# Patient Record
Sex: Male | Born: 1975 | Hispanic: No | Marital: Married | Smoking: Former smoker
Health system: Southern US, Community
[De-identification: ages and names within clinical notes are randomized; demographics above are authoritative.]

---

## 1999-05-13 ENCOUNTER — Emergency Department (HOSPITAL_COMMUNITY): Admission: EM | Admit: 1999-05-13 | Discharge: 1999-05-13 | Payer: Self-pay | Admitting: Emergency Medicine

## 2015-02-02 ENCOUNTER — Ambulatory Visit (INDEPENDENT_AMBULATORY_CARE_PROVIDER_SITE_OTHER): Payer: Self-pay | Admitting: Emergency Medicine

## 2015-02-02 VITALS — BP 132/90 | HR 109 | Temp 97.8°F | Resp 16 | Ht 71.0 in | Wt 209.0 lb

## 2015-02-02 DIAGNOSIS — M7711 Lateral epicondylitis, right elbow: Secondary | ICD-10-CM

## 2015-02-02 MED ORDER — METHYLPREDNISOLONE ACETATE 80 MG/ML IJ SUSP: 80.0000 mg | Freq: Once | INTRAMUSCULAR | Status: DC

## 2015-02-02 MED ORDER — NAPROXEN SODIUM 550 MG PO TABS: 550.0000 mg | ORAL_TABLET | Freq: Two times a day (BID) | ORAL | Status: DC

## 2015-02-02 MED ORDER — TENNIS ELBOW STRAP MISC: Status: DC

## 2015-02-02 NOTE — Progress Notes (Signed)
Subjective:  Patient ID: Donald Estes, male    DOB: 09/12/1975  Age: 39 y.o. MRN: [id number]  CC: Elbow Pain and Leg Pain   HPI Donald Estes presents  patient has a three-month history of pain in his lateral epicondyle right elbow he has no history of direct injury. He thinks he may have bumped it on something. He has no history of fever or chills no gout. No cellulitis. Said that he's had intermittent swelling in his lateral elbow. He works doing [employer].  History Donald Estes has no past medical history on file.   He has no past surgical history on file.   His  family history includes Diabetes in his mother.  He   reports that he quit smoking about 10 months ago. He does not have any smokeless tobacco history on file. He reports that he does not drink alcohol or use illicit drugs.  No outpatient prescriptions prior to visit.   No facility-administered medications prior to visit.    Social History   Social History  . Marital Status: Married    Spouse Name: N/A  . Number of Children: N/A  . Years of Education: N/A   Social History Main Topics  . Smoking status: Former Smoker    Quit date: 03/13/2014  . Smokeless tobacco: None  . Alcohol Use: No  . Drug Use: No  . Sexual Activity: Not Asked   Other Topics Concern  . None   Social History Narrative  . None     Review of Systems  Constitutional: Negative for fever, chills and appetite change.  HENT: Negative for congestion, ear pain, postnasal drip, sinus pressure and sore throat.   Eyes: Negative for pain and redness.  Respiratory: Negative for cough, shortness of breath and wheezing.   Cardiovascular: Negative for leg swelling.  Gastrointestinal: Negative for nausea, vomiting, abdominal pain, diarrhea, constipation and blood in stool.  Endocrine: Negative for polyuria.  Genitourinary: Negative for dysuria, urgency, frequency and flank pain.  Musculoskeletal: Negative for gait problem.  Skin: Negative for rash.    Neurological: Negative for weakness and headaches.  Psychiatric/Behavioral: Negative for confusion and decreased concentration. The patient is not nervous/anxious.     Objective:  BP 132/90 mmHg  Pulse 109  Temp(Src) 97.8 F (36.6 C) (Oral)  Resp 16  Ht 5\' 11"  (1.803 m)  Wt 209 lb (94.802 kg)  BMI 29.16 kg/m2  SpO2 98%  Physical Exam  Constitutional: He is oriented to person, place, and time. He appears well-developed and well-nourished. No distress.  HENT:  Head: Normocephalic and atraumatic.  Right Ear: External ear normal.  Left Ear: External ear normal.  Nose: Nose normal.  Eyes: Conjunctivae and EOM are normal. Pupils are equal, round, and reactive to light. No scleral icterus.  Neck: Normal range of motion. Neck supple. No tracheal deviation present.  Cardiovascular: Normal rate, regular rhythm and normal heart sounds.   Pulmonary/Chest: Effort normal. No respiratory distress. He has no wheezes. He has no rales.  Abdominal: He exhibits no mass. There is no tenderness. There is no rebound and no guarding.  Musculoskeletal: He exhibits no edema.       Right elbow: He exhibits decreased range of motion. Tenderness found. Lateral epicondyle tenderness noted.  Lymphadenopathy:    He has no cervical adenopathy.  Neurological: He is alert and oriented to person, place, and time. Coordination normal.  Skin: Skin is warm and dry. No rash noted.  Psychiatric: He has a normal mood and affect. His  behavior is normal.      Assessment & Plan:   Donald Estes was seen today for elbow pain and leg pain.  Diagnoses and all orders for this visit:  Tennis elbow, right -     methylPREDNISolone acetate (DEPO-MEDROL) injection 80 mg; Inject 1 mL (80 mg total) into the muscle once.  Other orders -     naproxen sodium (ANAPROX DS) 550 MG tablet; Take 1 tablet (550 mg total) by mouth 2 (two) times daily with a meal. -     Elastic Bandages & Supports (TENNIS ELBOW STRAP) MISC; Use daily   I  am having Donald Estes start on naproxen sodium and Tennis Elbow Strap. I am also having him maintain his ibuprofen and naproxen sodium. We will continue to administer methylPREDNISolone acetate.  Meds ordered this encounter  Medications  . ibuprofen (ADVIL,MOTRIN) 200 MG tablet    Sig: Take 200 mg by mouth every 6 (six) hours as needed.  . naproxen sodium (ANAPROX) 220 MG tablet    Sig: Take 220 mg by mouth 2 (two) times daily with a meal.  . methylPREDNISolone acetate (DEPO-MEDROL) injection 80 mg    Sig:   . naproxen sodium (ANAPROX DS) 550 MG tablet    Sig: Take 1 tablet (550 mg total) by mouth 2 (two) times daily with a meal.    Dispense:  40 tablet    Refill:  0  . Elastic Bandages & Supports (TENNIS ELBOW STRAP) MISC    Sig: Use daily    Dispense:  1 each    Refill:  0   He was injected with 40 mg Depo-Medrol and good relief of his pain acutely due to the local anesthetic and was put on Anaprox and tennis elbow support of follow-up as needed  Appropriate red flag conditions were discussed with the patient as well as actions that should be taken.  Patient expressed his understanding.  Follow-up: Return if symptoms worsen or fail to improve.  Carmelina Dane, MD

## 2015-02-02 NOTE — Patient Instructions (Signed)
Codo de tenista  (Tennis Elbow)  El codo de tenista (epicondilitis lateral) es la inflamación de los tendones externos del antebrazo cerca del codo. Los tendones unen los músculos con los huesos. Los tendones externos del antebrazo intervienen en la extensión de la muñeca y su punto de unión se encuentra en la parte externa del codo. A menudo, el codo de tenista aparece en aquellos que juegan al tenis, pero cualquier persona puede tener la afección si extiende la muñeca o gira el antebrazo repetidas veces.  CAUSAS  La causa de la afección es la extensión de la muñeca y el uso de las manos de manera reiterada. Puede ser consecuencia de la práctica de deportes o la realización de tareas que exigen movimientos repetitivos del antebrazo. El codo de tenista también puede deberse a una lesión.  FACTORES DE RIESGO  Corre un riesgo más elevado de tener codo de tenista si juega al tenis o practica otro deporte con raqueta. Además, si usa frecuentemente las manos para trabajar. Es más probable que esta afección se manifieste en:  · Los músicos.  · Los carpinteros, los pintores y los plomeros.  · Los cocineros.  · Los cajeros.  · Los trabajadores de fábricas.  · Los trabajadores de la construcción.  · Los carniceros.  · Los usuarios de computadoras.  SÍNTOMAS  Los síntomas de esta afección incluyen lo siguiente:  · Dolor y sensibilidad a la palpación en el antebrazo y la parte externa del codo. Tal vez solo sienta dolor cuando use el brazo, o incluso cuando no lo haga.  · Una sensación de ardor que va desde el codo hasta el brazo.  · Agarre débil de las manos.  DIAGNÓSTICO   Este trastorno se puede diagnosticar mediante la historia clínica y un examen físico. También pueden hacerle otros estudios, por ejemplo:  · Radiografías.  · Resonancia magnética.  TRATAMIENTO  El médico le recomendará que haga cambios en el estilo de vida, por ejemplo, que ponga el brazo en reposo y se aplique hielo. El tratamiento también puede incluir  lo siguiente:  · Medicamentos para la inflamación. Estos pueden incluir inyecciones de cortisona si el dolor continúa.  · Fisioterapia. Este tratamiento puede incluir masajes o ejercicios.  · Un dispositivo ortopédico para el codo.  En última instancia, se puede recomendar cirugía si el dolor no desaparece con el tratamiento.  INSTRUCCIONES PARA EL CUIDADO EN EL HOGAR  Actividad  · Mantenga el codo y la muñeca en reposo como se lo haya indicado el médico. Intente no realizar ninguna actividad que pueda haber causado el problema hasta que el médico se lo permita.  · Si un fisioterapeuta le enseña ejercicios, hágalos como se lo hayan indicado.  · Si levanta un objeto, hágalo con la palma de la mano hacia arriba. Esto reduce el esfuerzo en el codo.  Estilo de vida  · Si el codo de tenista se debe a los deportes, revise el equipo y asegúrese de lo siguiente:    Que lo usa correctamente.    Que es la opción más adecuada para usted.  · Si el codo de tenista se debe al trabajo, tómese descansos con frecuencia, si es posible. Hable con el gerente sobre la mejor forma de realizar las tareas de un modo que sea seguro.    Si el codo de tenista se debe al uso de la computadora, hable con el gerente sobre los cambios que pueden hacerse en su lugar de trabajo.  Instrucciones generales  ·   Si se lo indican, aplique hielo sobre la zona dolorida:    Ponga el hielo en una bolsa plástica.    Coloque una toalla entre la piel y la bolsa de hielo.    Coloque el hielo durante 20 minutos, 2 a 3 veces por día.  · Tome los medicamentos solamente como se lo haya indicado el médico.  · Si le indicaron que use un dispositivo ortopédico, úselo como se lo haya indicado el médico.  · Concurra a todas las visitas de control como se lo haya indicado el médico. Esto es importante.  SOLICITE ATENCIÓN MÉDICA SI:  · El dolor no mejora con el tratamiento.  · El dolor empeora.  · Tiene adormecimiento o debilidad en el antebrazo, la mano o los dedos de la  mano.     Esta información no tiene como fin reemplazar el consejo del médico. Asegúrese de hacerle al médico cualquier pregunta que tenga.     Document Released: 02/27/2005 Document Revised: 07/14/2014  Elsevier Interactive Patient Education ©2016 Elsevier Inc.

## 2015-11-02 ENCOUNTER — Encounter: Payer: Self-pay | Admitting: Physician Assistant

## 2015-11-02 ENCOUNTER — Ambulatory Visit (INDEPENDENT_AMBULATORY_CARE_PROVIDER_SITE_OTHER): Payer: Self-pay | Admitting: Physician Assistant

## 2015-11-02 VITALS — BP 122/80 | HR 122 | Temp 98.2°F | Resp 17 | Ht 71.0 in | Wt 208.0 lb

## 2015-11-02 DIAGNOSIS — M2142 Flat foot [pes planus] (acquired), left foot: Secondary | ICD-10-CM

## 2015-11-02 DIAGNOSIS — M7742 Metatarsalgia, left foot: Secondary | ICD-10-CM

## 2015-11-02 DIAGNOSIS — M7741 Metatarsalgia, right foot: Secondary | ICD-10-CM

## 2015-11-02 DIAGNOSIS — M2141 Flat foot [pes planus] (acquired), right foot: Secondary | ICD-10-CM

## 2015-11-02 MED ORDER — MELOXICAM 15 MG PO TABS: 15.0000 mg | ORAL_TABLET | Freq: Every day | ORAL | 1 refills | Status: DC

## 2015-11-02 NOTE — Progress Notes (Signed)
Donald Estes  MRN: [id number] DOB: 12/20/1975  PCP: No PCP Per Patient  Subjective:  Pt is a 40 year old male presenting to clinic for joint pain x 1 year.  His pain is located in his right knee and both ankles, is constant and present every day. Less painful when he rests, more painful when he is walking. The most painful area is his right foot on the inside of his ankle and the top and bottom aspect of his big toe joint.  Taking Ibuprofen and OTC glucosamine.  Reports lower back pain last year after volley ball games. His pain was so bad he "walked crooked". He says it wasn't until after his back pain that his right knee started hurting, then his feet.    He says his big toe joint occasionally gets hot and painful. Eats mostly fast food daily. Coffee, monster, coke. No alcohol.  Went to Dr. Tasia [facility] last month. Had labs drawn and x-ray of his right foot, was told it was borderline gout. No medication was prescribed at the visit.      Review of Systems  Constitutional: Negative.   Cardiovascular: Negative.   Gastrointestinal: Negative.   Genitourinary: Negative for difficulty urinating, dysuria and testicular pain.  Musculoskeletal: Positive for arthralgias (right knee, both ankles), back pain (chronic low back pain), gait problem and joint swelling. Negative for myalgias, neck pain and neck stiffness.  Skin: Positive for color change (right big toe occasionally turns red). Negative for wound.    There are no active problems to display for this patient.   Current Outpatient Prescriptions on File Prior to Visit  Medication Sig Dispense Refill  . ibuprofen (ADVIL,MOTRIN) 200 MG tablet Take 200 mg by mouth every 6 (six) hours as needed.    . naproxen sodium (ANAPROX) 220 MG tablet Take 220 mg by mouth 2 (two) times daily with a meal.     Current Facility-Administered Medications on File Prior to Visit  Medication Dose Route Frequency Provider Last Rate Last Dose  .  methylPREDNISolone acetate (DEPO-MEDROL) injection 80 mg  80 mg Intramuscular Once Carmelina DaneJeffery S Anderson, MD        No Known Allergies  Objective:  BP 122/80 (BP Location: Left Arm, Patient Position: Sitting, Cuff Size: Normal)   Pulse (!) 122   Temp 98.2 F (36.8 C) (Oral)   Resp 17   Ht 5\' 11"  (1.803 m)   Wt 208 lb (94.3 kg)   SpO2 97%   BMI 29.01 kg/m   Physical Exam  Constitutional: He is oriented to person, place, and time and well-developed, well-nourished, and in no distress. No distress.  Cardiovascular: Normal rate, regular rhythm and normal heart sounds.   Musculoskeletal:  Right medial malleolus is swollen compared to left. No deformity, erythema, sign of trauma appreciated. TTP medial malleolus, right.  Pes planus noted. Mild eversion of heel with gait. Pain elicited with walking.     Neurological: He is alert and oriented to person, place, and time. GCS score is 15.  Skin: Skin is warm and dry.  Psychiatric: Mood, memory, affect and judgment normal.  Vitals reviewed.   Called [facility] for results.  Uric acid level 5.5 last month.  Assessment and Plan :  1. Metatarsalgia of both feet 2. Pes planus of both feet - meloxicam (MOBIC) 15 MG tablet; Take 1 tablet (15 mg total) by mouth daily.  Dispense: 30 tablet; Refill: 1 - Plan discussed with patient to try insoles to  correct gait. History of back pain prior to onset of knee, then ankle pain suggests this is ortho in nature. Patient does not have insurance, so will start with proper footwear/insoles. He agrees. RTC if symptoms are not improving in 1-2 months. Will refer to ortho for evaluation and orthotics at that time.  - Patient education. Instructions printed out and discussed with patient about diet to prevent gout attacks.    Marco CollieWhitney Giorgio Chabot, PA-C  [facility] 11/02/2015 1:49 PM

## 2015-11-02 NOTE — Patient Instructions (Addendum)
Off and Running for insoles    IF you received an x-ray today, you will receive an invoice from [facility]. Please contact [facility] at [phone] with questions or concerns regarding your invoice.   IF you received labwork today, you will receive an invoice from [facility]. Please contact [facility] at [phone] with questions or concerns regarding your invoice.   Our billing staff will not be able to assist you with questions regarding bills from these companies.  You will be contacted with the lab results as soon as they are available. The fastest way to get your results is to activate your My Chart account. Instructions are located on the last page of this paperwork. If you have not heard from us regarding the results in 2 weeks, please contact this office.      Gout Gout is an inflammatory arthritis caused by a buildup of uric acid crystals in the joints. Uric acid is a chemical that is normally present in the blood. When the level of uric acid in the blood is too high it can form crystals that deposit in your joints and tissues. This causes joint redness, soreness, and swelling (inflammation). Repeat attacks are common. Over time, uric acid crystals can form into masses (tophi) near a joint, destroying bone and causing disfigurement. Gout is treatable and often preventable. CAUSES  The disease begins with elevated levels of uric acid in the blood. Uric acid is produced by your body when it breaks down a naturally found substance called purines. Certain foods you eat, such as meats and fish, contain high amounts of purines. Causes of an elevated uric acid level include:  Being passed down from parent to child (heredity).  Diseases that cause increased uric acid production (such as obesity, psoriasis, and certain cancers).  Excessive alcohol use.  Diet, especially diets rich in meat and seafood.  Medicines, including certain  cancer-fighting medicines (chemotherapy), water pills (diuretics), and aspirin.  Chronic kidney disease. The kidneys are no longer able to remove uric acid well.  Problems with metabolism. Conditions strongly associated with gout include:  Obesity.  High blood pressure.  High cholesterol.  Diabetes. Not everyone with elevated uric acid levels gets gout. It is not understood why some people get gout and others do not. Surgery, joint injury, and eating too much of certain foods are some of the factors that can lead to gout attacks. SYMPTOMS   An attack of gout comes on quickly. It causes intense pain with redness, swelling, and warmth in a joint.  Fever can occur.  Often, only one joint is involved. Certain joints are more commonly involved:  Base of the big toe.  Knee.  Ankle.  Wrist.  Finger. Without treatment, an attack usually goes away in a few days to weeks. Between attacks, you usually will not have symptoms, which is different from many other forms of arthritis. DIAGNOSIS  Your caregiver will suspect gout based on your symptoms and exam. In some cases, tests may be recommended. The tests may include:  Blood tests.  Urine tests.  X-rays.  Joint fluid exam. This exam requires a needle to remove fluid from the joint (arthrocentesis). Using a microscope, gout is confirmed when uric acid crystals are seen in the joint fluid. TREATMENT  There are two phases to gout treatment: treating the sudden onset (acute) attack and preventing attacks (prophylaxis).  Treatment of an Acute Attack.  Medicines are used. These include anti-inflammatory medicines or steroid medicines.  An injection  of steroid medicine into the affected joint is sometimes necessary.  The painful joint is rested. Movement can worsen the arthritis.  You may use warm or cold treatments on painful joints, depending which works best for you.  Treatment to Prevent Attacks.  If you suffer from  frequent gout attacks, your caregiver may advise preventive medicine. These medicines are started after the acute attack subsides. These medicines either help your kidneys eliminate uric acid from your body or decrease your uric acid production. You may need to stay on these medicines for a very long time.  The early phase of treatment with preventive medicine can be associated with an increase in acute gout attacks. For this reason, during the first few months of treatment, your caregiver may also advise you to take medicines usually used for acute gout treatment. Be sure you understand your caregiver's directions. Your caregiver may make several adjustments to your medicine dose before these medicines are effective.  Discuss dietary treatment with your caregiver or dietitian. Alcohol and drinks high in sugar and fructose and foods such as meat, poultry, and seafood can increase uric acid levels. Your caregiver or dietitian can advise you on drinks and foods that should be limited. HOME CARE INSTRUCTIONS   Do not take aspirin to relieve pain. This raises uric acid levels.  Only take over-the-counter or prescription medicines for pain, discomfort, or fever as directed by your caregiver.  Rest the joint as much as possible. When in bed, keep sheets and blankets off painful areas.  Keep the affected joint raised (elevated).  Apply warm or cold treatments to painful joints. Use of warm or cold treatments depends on which works best for you.  Use crutches if the painful joint is in your leg.  Drink enough fluids to keep your urine clear or pale yellow. This helps your body get rid of uric acid. Limit alcohol, sugary drinks, and fructose drinks.  Follow your dietary instructions. Pay careful attention to the amount of protein you eat. Your daily diet should emphasize fruits, vegetables, whole grains, and fat-free or low-fat milk products. Discuss the use of coffee, vitamin C, and cherries with your  caregiver or dietitian. These may be helpful in lowering uric acid levels.  Maintain a healthy body weight. SEEK MEDICAL CARE IF:   You develop diarrhea, vomiting, or any side effects from medicines.  You do not feel better in 24 hours, or you are getting worse. SEEK IMMEDIATE MEDICAL CARE IF:   Your joint becomes suddenly more tender, and you have chills or a fever. MAKE SURE YOU:   Understand these instructions.  Will watch your condition.  Will get help right away if you are not doing well or get worse.   This information is not intended to replace advice given to you by your health care provider. Make sure you discuss any questions you have with your health care provider.   Document Released: 02/25/2000 Document Revised: 03/20/2014 Document Reviewed: 10/11/2011 Elsevier Interactive Patient Education [employer]2016 Elsevier Inc.

## 2015-12-31 ENCOUNTER — Ambulatory Visit (INDEPENDENT_AMBULATORY_CARE_PROVIDER_SITE_OTHER): Payer: Self-pay | Admitting: Physician Assistant

## 2015-12-31 VITALS — BP 122/74 | HR 116 | Temp 98.3°F | Resp 18 | Ht 71.0 in | Wt 210.0 lb

## 2015-12-31 DIAGNOSIS — M7742 Metatarsalgia, left foot: Secondary | ICD-10-CM

## 2015-12-31 DIAGNOSIS — M7741 Metatarsalgia, right foot: Secondary | ICD-10-CM

## 2015-12-31 DIAGNOSIS — M79671 Pain in right foot: Secondary | ICD-10-CM

## 2015-12-31 DIAGNOSIS — M79672 Pain in left foot: Secondary | ICD-10-CM

## 2015-12-31 MED ORDER — DICLOFENAC SODIUM 1 % TD GEL: 2.0000 g | Freq: Four times a day (QID) | TRANSDERMAL | 10 refills | Status: DC

## 2015-12-31 MED ORDER — MELOXICAM 15 MG PO TABS: 15.0000 mg | ORAL_TABLET | Freq: Every day | ORAL | 1 refills | Status: DC

## 2015-12-31 NOTE — Progress Notes (Signed)
   Donald Estes  MRN: [id number] DOB: 03/04/1976  PCP: No PCP Per Patient  Subjective:  Pt is a 40 year old male who presents to clinic for follow-up foot pain x 1 year. He was here 2 months ago for the same. At that time he was advised to use proper orthotics and use Mobic for pain. Since that visit, he notes pain eased with Mobic and the orthotic insoles, however is not completely gone.  Pain has moved from the bottom of his foot to the inferoanterior aspect of the inside of his ankle. Pain is worse with walking and walking up and down stairs. His wife states this is affecting his daily activity as they could not go to the fair because there is too much walking and he would be in too much pain.  Of note, the pain in his back and right knee is gone.  Review of Systems  Constitutional: Positive for activity change (due to foot pain). Negative for chills, fatigue and fever.  Respiratory: Negative for chest tightness and shortness of breath.   Cardiovascular: Negative for chest pain and palpitations.  Musculoskeletal: Positive for gait problem (due to foot pain). Negative for back pain and joint swelling.  Skin: Negative.     There are no active problems to display for this patient.   Current Outpatient Prescriptions on File Prior to Visit  Medication Sig Dispense Refill  . meloxicam (MOBIC) 15 MG tablet Take 1 tablet (15 mg total) by mouth daily. 30 tablet 1  . glucosamine-chondroitin 500-400 MG tablet Take 1 tablet by mouth 3 (three) times daily.     Current Facility-Administered Medications on File Prior to Visit  Medication Dose Route Frequency Provider Last Rate Last Dose  . methylPREDNISolone acetate (DEPO-MEDROL) injection 80 mg  80 mg Intramuscular Once Carmelina DaneJeffery S Anderson, MD        No Known Allergies  Objective:  BP 122/74 (BP Location: Right Arm, Patient Position: Sitting, Cuff Size: Small)   Pulse (!) 116   Temp 98.3 F (36.8 C) (Oral)   Resp 18   Ht 5\' 11"  (1.803 m)    Wt 210 lb (95.3 kg)   SpO2 99%   BMI 29.29 kg/m   Physical Exam  Constitutional: He is oriented to person, place, and time and well-developed, well-nourished, and in no distress. No distress.  Cardiovascular: Normal rate, regular rhythm and normal heart sounds.   Musculoskeletal:       Left foot: There is tenderness. There is no swelling, no crepitus and no deformity.       Feet:  Neurological: He is alert and oriented to person, place, and time. GCS score is 15.  Skin: Skin is warm and dry.  Psychiatric: Mood, memory, affect and judgment normal.  Vitals reviewed.   Assessment and Plan :  1. Pain in both feet 2. Metatarsalgia of both feet - diclofenac sodium (VOLTAREN) 1 % GEL; Apply 2 g topically 4 (four) times daily.  Dispense: 100 g; Refill: 10 - meloxicam (MOBIC) 15 MG tablet; Take 1 tablet (15 mg total) by mouth daily.  Dispense: 30 tablet; Refill: 1 - Patient is to follow-up with Dr. Denyse Amassorey for orthopedic rehab and follow-up.    Marco CollieWhitney Justa Hatchell, PA-C  [facility] 12/31/2015 4:20 PM

## 2015-12-31 NOTE — Patient Instructions (Addendum)
Please follow up with Dr. Georgina Snell.   Thank you for coming in today. I hope you feel we met your needs.  Feel free to call [facility] if you have any questions or further requests.  Please consider signing up for MyChart if you do not already have it, as this is a great way to communicate with me.  Best,  Whitney McVey, PA-C    IF you received an x-ray today, you will receive an invoice from [facility]. Please contact [facility] at [phone] with questions or concerns regarding your invoice.   IF you received labwork today, you will receive an invoice from [employer]. Please contact [facility] at [phone] with questions or concerns regarding your invoice.   Our billing staff will not be able to assist you with questions regarding bills from these companies.  You will be contacted with the lab results as soon as they are available. The fastest way to get your results is to activate your My Chart account. Instructions are located on the last page of this paperwork. If you have not heard from [place] regarding the results in 2 weeks, please contact this office.

## 2016-01-05 ENCOUNTER — Encounter: Payer: Self-pay | Admitting: Family Medicine

## 2016-01-05 ENCOUNTER — Ambulatory Visit (INDEPENDENT_AMBULATORY_CARE_PROVIDER_SITE_OTHER): Payer: Self-pay | Admitting: Family Medicine

## 2016-01-05 DIAGNOSIS — M76821 Posterior tibial tendinitis, right leg: Secondary | ICD-10-CM | POA: Insufficient documentation

## 2016-01-05 DIAGNOSIS — M76822 Posterior tibial tendinitis, left leg: Secondary | ICD-10-CM

## 2016-01-05 MED ORDER — NITROGLYCERIN 0.2 MG/HR TD PT24: MEDICATED_PATCH | TRANSDERMAL | 1 refills | Status: DC

## 2016-01-05 NOTE — Progress Notes (Signed)
   Subjective:    I'm seeing this patient as a consultation for:    JPMorgan Chase & CoElizabeth Whitney McVey, PA-C      CC: BL ankle pain  HPI: She notes a several week history of bilateral medial ankle pain. Pain is quite severe at times. Pain is worse with standing and walking and better with rest. His pain is located at the medial ankle extending from the ankle down into the mid foot. He denies any further radiating pain weakness or numbness. No fevers chills nausea vomiting or diarrhea. No injury noted. He is seen in urgent care the 20th and given diclofenac gel which he's used a few times with not much benefit yet.  Past medical history, Surgical history, Family history not pertinant except as noted below, Social history, Allergies, and medications have been entered into the medical record, reviewed, and no changes needed.   Review of Systems: No headache, visual changes, nausea, vomiting, diarrhea, constipation, dizziness, abdominal pain, skin rash, fevers, chills, night sweats, weight loss, swollen lymph nodes, body aches, joint swelling, muscle aches, chest pain, shortness of breath, mood changes, visual or auditory hallucinations.   Objective:    Vitals:   01/05/16 1502  BP: (!) 146/82  Pulse: 98   General: Well Developed, well nourished, and in no acute distress.  Neuro/Psych: Alert and oriented x3, extra-ocular muscles intact, able to move all 4 extremities, sensation grossly intact. Skin: Warm and dry, no rashes noted.  Respiratory: Not using accessory muscles, speaking in full sentences, trachea midline.  Cardiovascular: Pulses palpable, no extremity edema. Abdomen: Does not appear distended. MSK: Ankles bilaterally are relatively normal appearing without significant swelling. Patient has significant pain with toe standing and an absence of normal heel varus. He is tender to palpation along the course of the posterior tibialis tendon and has pain with resisted foot inversion. Pulses  capillary refill and sensation are intact distally.  Limited musculoskeletal ultrasound of the medial ankles bilaterally. Things bilaterally have intact. Posterior tibialis tendon. However the tendon is encased within a hypoechoic fluid containing sheath extending from the medial malleolus to the tarsal tunnel. The swelling is worse at the tarsal tunnel. There is increased Doppler activity seen within the tendon and tendon sheath at the tarsal tunnel consistent with neovessels. No large blood flow seen within the tendon however. The tendon appears to be intact with no significant defects bilaterally. Normal bony structures bilaterally.  No results found for this or any previous visit (from the past 24 hour(s)). No results found.  Impression and Recommendations:    Assessment and Plan: 40 y.o. male with Posterior tibial tendinitis bilaterally. Patient did not get much benefit from diclofenac gel but has been doing some home exercise program which has helped. Plan to use nitroglycerine patches and home exercise program. Recheck in 1 month.    Discussed warning signs or symptoms. Please see discharge instructions. Patient expresses understanding.

## 2016-01-05 NOTE — Patient Instructions (Signed)
Thank you for coming in today. Return in 1 month if not better.  Continue the exercise.  Return sooner if needed.  Use the pads in the shoes.   Nitroglycerin Protocol   Apply 1/4 nitroglycerin patch to affected area daily.  Change position of patch within the affected area every 24 hours.  You may experience a headache during the first 1-2 weeks of using the patch, these should subside.  If you experience headaches after beginning nitroglycerin patch treatment, you may take your preferred over the counter pain reliever.  Another side effect of the nitroglycerin patch is skin irritation or rash related to patch adhesive.  Please notify our office if you develop more severe headaches or rash, and stop the patch.  Tendon healing with nitroglycerin patch may require 12 to 24 weeks depending on the extent of injury.  Men should not use if taking Viagra, Cialis, or Levitra.   Do not use if you have migraines or rosacea.    Tendinitis del tendn tibial posterior con rehabilitacin (Posterior Tibial Tendon Tendinitis With Rehab) La tendinitis es una enfermedad que se caracteriza por la inflamacin de un tendn o el revestimiento (vaina) que lo rodea. La inflamacin generalmente est causada por daos en el tendn, como un desgarro (esguince). Los esguinces se clasifican en tres categoras. Los esguinces de grado 1 ocasionan dolor, pero el tendn no est alargado. En los esguinces de grado 2 hay un ligamento distendido, debido a un estiramiento o desgarro parcial. En el esguince de grado 2 se mantiene la funcin, aunque sta puede disminuir. Un esguince en grado 3 es la ruptura completa del msculo o el tendn, y suele quedar incapacitada la funcin. Esta tendinitis afecta el tendn tibial posterior, que une los msculos de la zona inferior de la pierna al pie. Este tendn se [employer]ack parte posterior del tobillo y [place] a que el tobillo se extienda (flexin plantar) y rote Honor Junes  (rotacin medial). SNTOMAS  Dolor, sensibilidad, hinchazn, calor o enrojecimiento en la parte trasera de la zona interna del tobillo, en el tendn tibial posterior o la zona interna del pie.  Dolor que empeora con la flexin plantar o la rotacin medial del tobillo.  Ruido de "crack" (crepitacin) al mover o tocar el tendn. CAUSAS La tendinitis del tendn tibial posterior se produce cuando una lesin en el tendn tibial posterior produce una respuesta inflamatoria. Los mecanismos ms frecuentes de una lesin son:  Geographical information systems officer (ocurre a medida que envejecemos) que debilita el tendn y lo hace ms susceptible a las lesiones.  Estrs sobre el tendn que proviene de un sbito aumento en la intensidad, frecuencia o duracin del entrenamiento.  Traumatismo directo en el tobillo.  Regreso a las actividades antes de que la lesin se cure. LOS RIESGOS AUMENTAN CON:  Actividades que implican una flexin plantar estresante (saltar, patear o correr Trey Paula y hacia abajo por una superficie inclinada).  Poca fuerza y flexibilidad.  Pie plano.  Lesiones previas del pi, tobillo o pierna. PREVENCIN   Precalentamiento adecuado y elongacin antes de la [place].  Descanso y recuperacin entre actividades.  Mantener la forma fsica:  Earma [place], flexibilidad y resistencia muscular.  Capacidad cardiovascular.  Aprenda y USAA. Cuando sea posible, tener un entrenador que corrija la Administrator.  Realizar una rehabilitacin completa de Neomia Dear lesin previa del pie, el tobillo o la pierna.  Las personas que presentan pie plano deben usar soportes en los arcos (ortopdicos). PRONSTICO Si se trata  adecuadamente, generalmente es curable dentro de las 6 semanas. Este perodo puede ser ms breve in los casos de lesiones por traumatismos directos. COMPLICACIONES RELACIONADAS  Si no se trata adecuadamente, mayor tiempo demorar la curacin.  La  recurrencia frecuente de los sntomas puede dar como resultado un problema crnico.  Si no se trata, la ruptura parcial o completa del tendn requiere Azerbaijanciruga. TRATAMIENTO El tratamiento inicial incluye el uso de medicamentos y la aplicacin de hielo para reducir Chief Technology Officerel dolor y la inflamacin. Los ejercicios de elongacin y fortalecimiento pueden ayudar a reducir Chief Technology Officerel dolor con la Stratfordactividad. Los ejercicios pueden Management consultantrealizarse en el hogar o con un terapeuta. Generalmente, el mdico indicar la inmovilizacin del tobillo para permitir la curacin del tendn. Si tiene pie plano, le aconsejar el uso de un soporte de arco ortopdico. Si los sntomas persisten por ms de 6 meses de tratamiento no quirrgico (conservador), se Public relations account executiveindicar la ciruga. MEDICAMENTOS  Si necesita analgsicos, se recomiendan los antiinflamatorios no esteroides, como aspirina e ibuprofeno y otros calmantes menores, como acetaminofeno  No tome medicamentos para Chief Technology Officerel dolor dentro de los [address] das previos a la Azerbaijanciruga.  Los analgsicos prescriptos se indicarn si el mdico lo considera necesario. Utilcelos como se le indique y slo cuando lo necesite.  En algunos casos se indica una inyeccin de corticosteroides. Estas inyecciones deben reservarse para los [place] B[place]asos graves, porque slo se pueden administrar una determinada cantidad de veces. CALOR Y FRO   El tratamiento con fro EchoStaralivia el dolor y reduce la inflamacin. El fro debe aplicarse durante 10 a 15 minutos cada 2  3 horas para reducir la inflamacin y Chief Technology Officerel dolor e inmediatamente despus de cualquier actividad que agrava los sntomas. Utilice bolsas de hielo o masajee la zona con un trozo de hielo (masaje de hielo).  El calor puede usarse antes de Therapist, musicelongar y de las actividades de fortalecimiento indicadas por el profesional, le fisioterapeuta o Orthoptistel entrenador. Utilice una bolsa trmica o sumerja la lesin en agua caliente. SOLICITE ATENCIN MDICA SI:  El tratamiento no Radiographer, therapeuticle da buenos  resultados o el problema Jacksonvilleempeora.  Algn medicamento le produce BB&T Corporationefectos adversos. EJERCISIOS EJERCICIOS DE AMPLITUD DE MOVIMIENTOS Y ELONGACIN - Tendinitis del tendn tibial posterior Estos ejercicios lo ayudarn al comienzo de la rehabilitacin. Los sntomas podrn aliviarse con o sin asistencia adicional de su mdico, fisioterapeuta o Herbalistentrenador. Al completar estos ejercicios, recuerde:   Restaurar la flexibilidad del tejido ayuda a que las articulaciones recuperen el movimiento normal. Esto permite que el movimiento y la actividad sea ms saludables y menos dolorosos.  Para que sea efectiva, cada elongacin debe realizarse durante al menos 30 segundos.  La elongacin nunca debe ser dolorosa. Deber sentir slo un alargamiento o distensin suave del tejido que estira. AMPLITUD DE MOVIMIENTOS - Flexin plantar del tobillo   Sintese con la pierna derecha / izquierdo cruzada sobre la rodilla opuesta.  Use la mano opuesta para empujar la punta del pie y los dedos Lewisvillehacia usted.  Debe sentir un estiramiento en la parte superior del pie o tobillo. Mantenga esta posicin durante __________ segundos. Reptalo __________ veces. Realice este ejercicio __________ veces por da.  AMPLITUD DE MOVIMIENTOS - Eversin del tobillo  Sintese con la pierna derecha / izquierdo cruzada sobre la rodilla opuesta.  Sostenga el pie con la mano [place]uttont[place]opuesta, colocando el Halliburton Companypulgar sobre el pie y los otros dedos cruzando la parte inferior del pie.  Presione suavemente el pie hacia abajo con una rotacin suave para que los dedos ms  pequeos se eleven ligeramente.  Debe sentir un estiramiento suave en la zona interior del tobillo. Mantenga esta posicin durante __________ segundos. Reptalo __________ veces. Realice este ejercicio __________ veces por da.  AMPLITUD DE MOVIMIENTOS - Inversin del tobillo  Sintese con la pierna derecha / izquierdo cruzada sobre la rodilla opuesta.  Sostenga el pie con la mano  opuesta, colocando el pulgar en la parte inferior y los otros dedos cruzando la parte superior del pie.  Tire suavemente del pie de modo que el dedo pequeo se incline [place] usted y Multimedia programmer empuje el tarso hacia afuera.  Debe sentir un estiramiento suave en la zona exterior del tobillo. Mantenga esta posicin durante __________ segundos. Reptalo __________ veces. Realice este ejercicio __________ veces por da.  AMPLITUD DE MOVIMIENTOS - Flexin dorso plantar  Mientras est sentado con la rodilla derecha / izquierdo derecha, lleve la parte superior del pie hacia arriba flexionando el tobillo. Luego realice [employer], y apunte con los pies Luther.  Mantenga esta posicin durante __________ segundos.  Luego de completar la primera serie de ejercicios, reptalo con la rodilla flexionada. Reptalo __________ veces. Realice este ejercicio __________ veces por da.  AMPLITUD DE MOVIMIENTOS - Alfabeto con el tobillo  Imagine que el dedo gordo del pie derecha / izquierdo es un lpiz.  Con la cadera y la rodilla quietas, escriba todo el alfabeto con el "lpiz." Llegue hasta donde pueda, sin sentir molestias. Reptalo __________ veces. Realice este ejercicio __________ veces por da.  ELONGACIN - Gastrocsoleus  Sintese con la pierna derecha / izquierdo extendida. Sostenga por los extremos un cinturn o toalla y colquela alrededor de la regin metatarsiana del pie.  Mantenga el tobillo y el pie derecha / izquierdo relajados y la rodilla recta, acerque el pie y el tobillo hacia usted con el cinturn o toalla.  Debe sentir un estiramiento en la pantorrilla o detrs de la rodilla. Mantenga esta posicin durante __________ segundos. Reptalo __________ veces. Realice este ejercicio __________ veces por da.  ELONGACIN - Gastroc Masco Corporation en la pared.  Extienda la pierna derecha / izquierdo y Dietitian la rodilla levemente flexionada.  Apunte los dedos  ligeramente hacia adentro con el pie de atrs.  Mantenga el taln derecha / izquierdo en el suelo y la rodilla recta, cambie el peso hacia la pared y no permita que la espalda se arquee.  Debe sentir un estiramiento en la pantorrila derecha / izquierdo. Mantenga esta posicin durante __________ segundos. Reptalo __________ veces. Realice este estiramiento __________ Anthoney Harada por da. ELONGACIN - Sleo - de pie  Coloque las manos en la pared.  Extienda la pierna derecha / izquierdo y Dietitian la rodilla levemente flexionada.  Apunte los dedos ligeramente hacia adentro con el pie de atrs.  Mantenga el taln derecha / izquierdo en el suelo, flexione la rodilla de atrs, y pase suavemente el peso hacia la pierna de atrs hasta que sienta un estiramiento suave en la parte de atrs de la pantorrilla.  Mantenga esta posicin durante __________ segundos. Reptalo __________ veces. Realice este estiramiento __________ Anthoney Harada por da. EJERCICIOS DE ELONGACIN - Tendinitis del tendn tibial posterior Estos ejercicios lo ayudarn al comienzo de la rehabilitacin. Los sntomas podrn aliviarse con o sin asistencia adicional de su mdico, fisioterapeuta o Herbalist. Al completar estos ejercicios, recuerde:   Los msculos pueden ganar tanto la resistencia como la fuerza que necesita para sus actividades diarias a travs de ejercicios controlados.  Realice los ejercicios como se  lo indic el mdico, el fisioterapeuta o Orthoptist. Aumente la resistencia y las repeticiones segn se le haya indicado. FUERZA - Dorsiflexores  Asegure una banda de goma para ejercicios a un objeto fijo (ej, mesa, palo) y enrosque el otro extremo alrededor del Secretary/administrator / izquierdo.  Sintese en el suelo de frente al objeto fijo. La banda debe estar ligeramente tensa cuando su pie est relajado.  Lentamente, lleve el pie hacia atrs con el tobillo y los dedos del pie.  Mantenga esta posicin durante __________  segundos. Libere la tensin de la banda lentamente y coloque el pie en la posicin inicial. Reptalo __________ veces. Realice este ejercicio __________ veces por da.  FUERZA - Rollo de toalla  Sintese en una silla en una superficie no alfombrada.  Coloque el pie en [address], y mantenga el taln en el suelo.  Coloque la toalla alrededor del taln pero envuelva slo los dedos del pie. Mantenga el taln contra el piso.  Aada ____________________ al extremo de la toalla si el mdico, fisioterapeuta o entrenador se lo indican. Reptalo __________ veces. Realice este ejercicio __________ veces por da. FUERZA - Eversin del tobillo  Asegure un extremo de una banda de goma para ejercicios a un objeto fijo (mesa, columna). Ate el extremo opuesto a su pie, justo antes de los dedos.  Coloque los [place]os [place] rodillas. Esto har que la fuerza se concentre en el tobillo.  Lleve la Huntsman Corporation contrario, lentamente, para que el dedo pequeo del pie salga apunte Normajean [place] arriba y [place]. Asegrese de que la banda est posicionada de tal forma que puede resistir el movimiento completo.  Mantenga esta posicin durante __________ segundos.  Haga que los msculos resistan la banda mientras tira lentamente el pie hacia atrs hasta la posicin inicial. Reptalo __________ veces. Realice este ejercicio __________ veces por da.  FUERZA - Inversin del tobillo  Asegure un extremo de una banda de goma para ejercicios a un objeto fijo (mesa, columna). Ate el extremo opuesto a su pie, justo antes de los dedos.  Coloque los [place] rodillas. Esto har que la fuerza se concentre en el tobillo.  Lentamente, tire del dedo gordo [place] y Honor Junes y asegrese de que la banda est posicionada de tal forma que puede resistir el movimiento completo.  Mantenga esta posicin durante __________ segundos.  Haga que los msculos resistan la banda mientras tira lentamente el pie hacia  atrs hasta la posicin inicial. Reptalo __________ veces. Realice este ejercicio __________ veces por da.    Esta informacin no tiene Theme park manager el consejo del mdico. Asegrese de hacerle al mdico cualquier pregunta que tenga.   Document Released: 12/14/2005 Document Revised: 07/14/2014 Elsevier Interactive Patient Education [employer]

## 2016-02-02 ENCOUNTER — Ambulatory Visit (INDEPENDENT_AMBULATORY_CARE_PROVIDER_SITE_OTHER): Payer: Self-pay | Admitting: Family Medicine

## 2016-02-02 ENCOUNTER — Encounter: Payer: Self-pay | Admitting: Family Medicine

## 2016-02-02 VITALS — BP 125/85 | HR 102 | Wt 207.0 lb

## 2016-02-02 DIAGNOSIS — M76821 Posterior tibial tendinitis, right leg: Secondary | ICD-10-CM

## 2016-02-02 DIAGNOSIS — M76822 Posterior tibial tendinitis, left leg: Secondary | ICD-10-CM

## 2016-02-02 DIAGNOSIS — M79674 Pain in right toe(s): Secondary | ICD-10-CM

## 2016-02-02 NOTE — Patient Instructions (Signed)
Thank you for coming in today. Use the gel.  Do the exercises.  Return for recheck in January.    Cmo colocar un vendaje de inmovilizacin (How to Owens & MinorBuddy Tape) El vendaje de inmovilizacin se refiere a la unin de un dedo lesionado, de la mano o del pie, a un dedo vecino sin lesin. Esto protege el dedo lesionado y evita que se mueva mientras la lesin se [place]. Puede colocar un vendaje de inmovilizacin en un dedo si tiene Engineer, petroleumun esguince menor. El mdico puede colocar un vendaje de inmovilizacin si usted tiene un esguince, una luxacin o Market researcheruna fractura. Le pueden indicar que reemplace el vendaje segn sea necesario. RIESGOS Y COMPLICACIONES En general, la colocacin de un vendaje de inmovilizacin es segura. Sin embargo, pueden presentarse problemas, por ejemplo:  Lesin o infeccin en la piel.  Reduccin del flujo sanguneo en el dedo de la mano o del pie.  Reaccin de la piel a la cinta del vendaje. No coloque un vendaje de inmovilizacin si tiene diabetes. No coloque un vendaje de inmovilizacin si sabe que tiene Namibiaalergia a [place]nes[place]adhesivos o a la Haiticinta quirrgica. CMO COLOCAR UN VENDAJE DE INMOVILIZACIN Antes de colocar el vendaje  Trate de reducir Chief Technology Officerel dolor y la hinchazn con reposo, hielo y elevacin del dedo afectado:  Evite las actividades que le causen Engineer, miningdolor.  Cuando est sentado o acostado, levante (eleve) la mano o el pie por encima del nivel del corazn.  Si se lo indican, aplique hielo sobre la zona lesionada:  Ponga el hielo en una bolsa plstica.  Coloque una toalla entre la piel y la bolsa de hielo.  Coloque el hielo durante 20minutos, 2 a 3veces por Futures traderda. Procedimiento para la colocacin del vendaje de inmovilizacin   Limpie y seque el dedo como se lo haya indicado el mdico.  Coloque una gasa o un trozo de algodn entre el dedo lesionado y el dedo sano.  Utilice cinta para envolver ambos dedos de modo que el dedo lesionado quede adherido al dedo sano.  La cinta debe  estar ajustada pero no apretada.  Asegrese de Leggett & Plattsuperponer los extremos de la cinta.  Evite colocar la Environmental health practitionercinta directamente sobre la articulacin.  Cambie la cinta y la gasa o algodn como se lo haya indicado el mdico. Retire, y Librarian, academicreemplace la cinta y la gasa o algodn si se aflojan, gastan, ensucian o humedecen. Despus de Education officer, communityla colocacin del vendaje de inmovilizacin   Baxter Internationalome los medicamentos de venta libre y los recetados solamente como se lo haya indicado el mdico.  Reanude sus actividades normales como se lo haya indicado el mdico. Pregntele al mdico qu actividades son seguras para usted.  Controle la zona del vendaje y siempre quteselo si:  El dolor Florenceempeora.  Los dedos se tornan plidos o azulados.  La piel se irrita. SOLICITE ATENCIN MDICA SI:  Tiene dolor, hinchazn o hematomas que duren ms de Kinder Morgan Energytres das.  Tiene fiebre.  La piel est enrojecida, agrietada o irritada. SOLICITE ATENCIN MDICA DE INMEDIATO SI:  La zona lesionada est fra, entumecida o plida.  Tiene dolor intenso, hinchazn, hematomas o prdida de movimiento en el dedo.  La forma del dedo cambia (deformidad). Esta informacin no tiene Theme park managercomo fin reemplazar el consejo del mdico. Asegrese de hacerle al mdico cualquier pregunta que tenga. Document Released: 02/27/2005 Document Revised: 06/21/2015 Document Reviewed: 07/22/2014 Elsevier Interactive Patient Education  2017 [employer]

## 2016-02-02 NOTE — Progress Notes (Signed)
   Donald Estes is a 40 y.o. male who [facility]aciella [facility] today for continued ankle pain and new right toe pain.  Patient has been seen for medial ankle pain bilaterally thought to be posterior tibialis tendinitis. He is feeling much better overall but continues to experience some pain. He never was able to use the nitroglycerin patches.  He does however note new right fourth toe pain. This occurred over the last week without injury and was associated with swelling. He has not had any treatment for it and it is already improving.   No past medical history on file. No past surgical history on file. Social History  Substance Use Topics  . Smoking status: Former Smoker    Quit date: 03/13/2014  . Smokeless tobacco: Never Used  . Alcohol use No     ROS:  As above   Medications: Current Outpatient Prescriptions  Medication Sig Dispense Refill  . glucosamine-chondroitin 500-400 MG tablet Take 1 tablet by mouth 3 (three) times daily.    . meloxicam (MOBIC) 15 MG tablet Take 1 tablet (15 mg total) by mouth daily. 30 tablet 1  . nitroGLYCERIN (NITRODUR - DOSED IN MG/24 HR) 0.2 mg/hr patch 1/4 patch to ankles daily for tendonitis. 30 patch 1   Current Facility-Administered Medications  Medication Dose Route Frequency Provider Last Rate Last Dose  . methylPREDNISolone acetate (DEPO-MEDROL) injection 80 mg  80 mg Intramuscular Once Carmelina DaneJeffery S Anderson, MD       No Known Allergies   Exam:  BP 125/85   Pulse (!) 102   Wt 207 lb (93.9 kg)   BMI 28.87 kg/m  General: Well Developed, well nourished, and in no acute distress.  Neuro/Psych: Alert and oriented x3, extra-ocular muscles intact, able to move all 4 extremities, sensation grossly intact. Skin: Warm and dry, no rashes noted.  Respiratory: Not using accessory muscles, speaking in full sentences, trachea midline.  Cardiovascular: Pulses palpable, no extremity edema. Abdomen: Does not  appear distended. MSK: Ankles are unremarkable appearing bilaterally nontender normal ankle motion. Normal strength.   right fourth toe PIP mildly swollen and mildly tender with normal motion. No skin erythema or induration.   No results found for this or any previous visit (from the past 48 hour(s)). No results found.    Assessment and Plan: 40 y.o. male with  Foot pain resolving posterior tibialis tendinitis. Continue exercises recheck in a few weeks.  Toe pain: Unclear etiology. Plan for watchful waiting treat with diclofenac gel meloxicam. Additionally use buddy tape.    No orders of the defined types were placed in this encounter.   Discussed warning signs or symptoms. Please see discharge instructions. Patient expresses understanding.

## 2016-02-07 ENCOUNTER — Telehealth: Payer: Self-pay

## 2016-02-07 DIAGNOSIS — M7741 Metatarsalgia, right foot: Secondary | ICD-10-CM

## 2016-02-07 DIAGNOSIS — M7742 Metatarsalgia, left foot: Principal | ICD-10-CM

## 2016-02-08 MED ORDER — MELOXICAM 15 MG PO TABS: 15.0000 mg | ORAL_TABLET | Freq: Every day | ORAL | 0 refills | Status: DC

## 2016-02-08 NOTE — Telephone Encounter (Signed)
Meloxicam sent in.

## 2016-02-08 NOTE — Telephone Encounter (Signed)
Notified patient.

## 2016-02-20 ENCOUNTER — Other Ambulatory Visit: Payer: Self-pay | Admitting: Physician Assistant

## 2016-02-20 DIAGNOSIS — M7741 Metatarsalgia, right foot: Secondary | ICD-10-CM

## 2016-02-20 DIAGNOSIS — M7742 Metatarsalgia, left foot: Principal | ICD-10-CM

## 2016-04-03 ENCOUNTER — Ambulatory Visit (INDEPENDENT_AMBULATORY_CARE_PROVIDER_SITE_OTHER): Payer: BLUE CROSS/BLUE SHIELD | Admitting: Family Medicine

## 2016-04-03 VITALS — BP 143/78 | HR 102 | Wt 207.0 lb

## 2016-04-03 DIAGNOSIS — M79674 Pain in right toe(s): Secondary | ICD-10-CM

## 2016-04-03 DIAGNOSIS — M76822 Posterior tibial tendinitis, left leg: Secondary | ICD-10-CM | POA: Diagnosis not present

## 2016-04-03 DIAGNOSIS — M76821 Posterior tibial tendinitis, right leg: Secondary | ICD-10-CM | POA: Diagnosis not present

## 2016-04-03 NOTE — Patient Instructions (Signed)
Thank you for coming in today. Attend PT.  Return in 1 month or so if not better.  At that time we will do an injection.

## 2016-04-03 NOTE — Progress Notes (Signed)
       Donald Estes is a 41 y.o. male who [facility] today for follow-up ankle pain. Patient returns to clinic today to discuss bilateral ankle pain. He's been diagnosed with posterior tibialis tendinitis bilaterally now for several months. He's had a trial of self physical therapy with nitroglycerin patches, diclofenac gel, scaphoid pads, metatarsal pads and relative rest. Additionally he's been using meloxicam which does help. He notes that his symptoms continued to be mild to moderate. Symptoms do limit his activity at time. He would like to avoid injections today if possible.   No past medical history on file. No past surgical history on file. Social History  Substance Use Topics  . Smoking status: Former Smoker    Quit date: 03/13/2014  . Smokeless tobacco: Never Used  . Alcohol use No   family history includes Diabetes in his mother.  ROS as above:  Medications: Current Outpatient Prescriptions  Medication Sig Dispense Refill  . glucosamine-chondroitin 500-400 MG tablet Take 1 tablet by mouth 3 (three) times daily.    . meloxicam (MOBIC) 15 MG tablet TAKE 1 TABLET(15 MG) BY MOUTH DAILY 30 tablet 2  . diclofenac sodium (VOLTAREN) 1 % GEL APP 2 GRAMS EXT AA QID  10  . nitroGLYCERIN (NITRODUR - DOSED IN MG/24 HR) 0.2 mg/hr patch APP 1/4 PATCH TO ANKLES D FOR TENDONITIS  1   No current facility-administered medications for this visit.    No Known Allergies  Health Maintenance Health Maintenance  Topic Date Due  . HIV Screening  12/15/1992  . TETANUS/TDAP  12/15/1996  . INFLUENZA VACCINE  06/10/2016 (Originally 10/12/2015)     Exam:  BP (!) 143/78   Pulse (!) 102   Wt 207 lb (93.9 kg)   BMI 28.87 kg/m   Gen: Well NAD HEENT: EOMI,  MMM Lungs: Normal work of breathing. CTABL Heart: RRR no MRG Abd: NABS, Soft. Nondistended, Nontender Exts: Brisk  capillary refill, warm and well perfused. Tender palpation left medial ankle pain with resisted ankle inversion. Normal motion otherwise. Right ankle is unremarkable.  Limited musculoskeletal ultrasound of ankles bilaterally show intact posterior tibialis tendons with enhanced hypoechoic fluids in the tendon sheath consistent with posterior tibialis tendinitis.   No results found for this or any previous visit (from the past 72 hour(s)). No results found.    Assessment and Plan: 41 y.o. male with bilateral left worse than right posterior tibialis tendinitis. Discussed options. Plan for trial formal physical therapy. Patient would like to avoid injection of possible.   Orders Placed This Encounter  Procedures  . Ambulatory referral to Physical Therapy    Referral Priority:   Routine    Referral Type:   Physical Medicine    Referral Reason:   Specialty Services Required    Requested Specialty:   Physical Therapy    Number of Visits Requested:   1    Discussed warning signs or symptoms. Please see discharge instructions. Patient expresses understanding.

## 2016-04-10 ENCOUNTER — Encounter: Payer: Self-pay | Admitting: Physical Therapy

## 2016-04-10 ENCOUNTER — Ambulatory Visit: Payer: BLUE CROSS/BLUE SHIELD | Attending: Family Medicine | Admitting: Physical Therapy

## 2016-04-10 DIAGNOSIS — R262 Difficulty in walking, not elsewhere classified: Secondary | ICD-10-CM | POA: Diagnosis present

## 2016-04-10 DIAGNOSIS — M25571 Pain in right ankle and joints of right foot: Secondary | ICD-10-CM | POA: Insufficient documentation

## 2016-04-10 DIAGNOSIS — M25572 Pain in left ankle and joints of left foot: Secondary | ICD-10-CM | POA: Diagnosis not present

## 2016-04-10 NOTE — Therapy (Signed)
[facility] [address] [place], [place], [address] Phone: [phone]   Fax:  [phone]  Physical Therapy Evaluation  Patient Details  Name: Donald Estes MRN: [id number] Date of Birth: 11/01/1975 Referring Provider: Clementeen GrahamEvan Corey  Encounter Date: 04/10/2016      PT End of Session - 04/10/16 1605    Visit Number 1   Date for PT Re-Evaluation 06/08/16   PT Start Time 1518   PT Stop Time 1605   PT Time Calculation (min) 47 min   Activity Tolerance Patient tolerated treatment well   Behavior During Therapy [facility] for tasks assessed/performed      History reviewed. No pertinent past medical history.  History reviewed. No pertinent surgical history.  There were no vitals filed for this visit.       Subjective Assessment - 04/10/16 1520    Subjective Patient reports to us with diagnosis of bilateral posterior tibialis tendonitis for about a year.  He has tried pain medication and ice with little to no help.  He feels like it was him with bare feet on hardwood floors for long times.  He reports he has had issues in the back.  He reports that he has new very supportive shoes and this helps the most   Limitations Walking;Standing   Patient Stated Goals have less pain    Currently in Pain? Yes   Pain Score 4    Pain Location Foot   Pain Orientation Right;Left;Medial   Pain Descriptors / Indicators Aching   Pain Type Acute pain   Pain Onset More than a month ago   Pain Frequency Constant   Aggravating Factors  standing walking pain up to 7/10   Pain Relieving Factors the shoes and ice help the most down to    Effect of Pain on Daily Activities difficulty with standing and walking            [facility] PT Assessment - 04/10/16 0001      Assessment   Medical Diagnosis posterior tibialis tendonitis   Referring Provider Clementeen GrahamEvan Corey   Onset Date/Surgical Date 03/10/16   Prior Therapy no     Precautions   Precautions  None     Balance Screen   Has the patient fallen in the past 6 months No   Has the patient had a decrease in activity level because of a fear of falling?  No   Is the patient reluctant to leave their home because of a fear of falling?  No     Home Environment   Additional Comments some housework     Prior Function   Level of Independence Independent   Vocation Full time employment   Vocation Requirements some lifting, climb ladders   Leisure no exercise     ROM / Strength   AROM / PROM / Strength AROM;PROM;Strength     AROM   AROM Assessment Site Ankle   Right/Left Ankle Right;Left   Right Ankle Dorsiflexion 3   Right Ankle Inversion 5   Right Ankle Eversion 0   Left Ankle Dorsiflexion 0   Left Ankle Inversion 5   Left Ankle Eversion 0     Strength   Overall Strength Comments right and left ankle inversion 4-/5 with pain medially     Flexibility   Soft Tissue Assessment /Muscle Length --  very tight calves     Palpation   Palpation comment very tender medially especially over the navicular  Ambulation/Gait   Gait Comments with shoes mild antalgic worse on the right, without shoes very slow with decreased step length                   [facility] Adult PT Treatment/Exercise - 04/10/16 0001      Modalities   Modalities Iontophoresis     Iontophoresis   Type of Iontophoresis Dexamethasone   Location bilateral navicular area   Dose 80   Time 4 hour patch                PT Education - 04/10/16 1604    Education provided Yes   Education Details calf stretches   Person(s) Educated Patient   Methods Explanation;Demonstration   Comprehension Verbalized understanding          PT Short Term Goals - 04/10/16 1608      PT SHORT TERM GOAL #1   Title independent with initial HEP   Time 2   Period Weeks   Status New           PT Long Term Goals - 04/10/16 1608      PT LONG TERM GOAL #1   Title no difficulty walking   Time 8   Period  Weeks   Status New     PT LONG TERM GOAL #2   Title decrease pain 50%   Time 8   Period Weeks   Status New     PT LONG TERM GOAL #3   Title increase DF actively to 10 degrees   Time 8   Period Weeks   Status New               Plan - 04/10/16 1605    Clinical Impression Statement Patient with bilateral posterior tibialis tendonitis.  He has very tight calves, decreased arch bilaterally, a new shoe he just bought seems to help him the most.  Poor DF bilaterally and painful/weak inversion with pain along the posterior tibialis tendon just proximal to the navicular, has difficulkty walking   Rehab Potential Good   PT Frequency 1x / week   PT Duration 8 weeks   PT Treatment/Interventions ADLs/Self Care Home Management;Cryotherapy;Electrical Stimulation;Iontophoresis 4mg /ml Dexamethasone;Ultrasound;Moist Heat;Therapeutic activities;Therapeutic exercise;Balance training;Neuromuscular re-education;Patient/family education;Manual techniques;Taping   PT Next Visit Plan may try [place], estim and Taping   Consulted and Agree with Plan of Care Patient      Patient will benefit from skilled therapeutic intervention in order to improve the following deficits and impairments:  Abnormal gait, Decreased range of motion, Difficulty walking, Decreased strength, Pain  Visit Diagnosis: Pain in left ankle and joints of left foot - Plan: PT plan of care cert/re-cert  Pain in right ankle and joints of right foot - Plan: PT plan of care cert/re-cert  Difficulty in walking, not elsewhere classified - Plan: PT plan of care cert/re-cert     Problem List Patient Active Problem List   Diagnosis Date Noted  . Toe pain, right 02/02/2016  . Posterior tibialis tendinitis of both lower extremities 01/05/2016    Jearld Lesch., PT 04/10/2016, 4:11 PM  [facility] [address] [place], [place], [address] Phone: [phone]   Fax:   [phone]  Name: Donald Estes MRN: [id number] Date of Birth: [birth year]

## 2016-04-17 ENCOUNTER — Encounter: Payer: BLUE CROSS/BLUE SHIELD | Admitting: Physical Therapy

## 2016-04-18 ENCOUNTER — Encounter: Payer: Self-pay | Admitting: Physical Therapy

## 2016-04-18 ENCOUNTER — Ambulatory Visit: Payer: BLUE CROSS/BLUE SHIELD | Attending: Family Medicine | Admitting: Physical Therapy

## 2016-04-18 DIAGNOSIS — R262 Difficulty in walking, not elsewhere classified: Secondary | ICD-10-CM | POA: Insufficient documentation

## 2016-04-18 DIAGNOSIS — M25572 Pain in left ankle and joints of left foot: Secondary | ICD-10-CM

## 2016-04-18 DIAGNOSIS — R2242 Localized swelling, mass and lump, left lower limb: Secondary | ICD-10-CM

## 2016-04-18 DIAGNOSIS — M25571 Pain in right ankle and joints of right foot: Secondary | ICD-10-CM | POA: Insufficient documentation

## 2016-04-18 NOTE — Therapy (Signed)
[facility] [address] [place], [place], [address] Phone: [phone]   Fax:  [phone]  Physical Therapy Treatment  Patient Details  Name: Donald Estes MRN: [id number] Date of Birth: Oct 12, 1975 Referring Provider: Clementeen Graham  Encounter Date: 04/18/2016      PT End of Session - 04/18/16 1745    Visit Number 2   Date for PT Re-Evaluation 06/08/16   PT Start Time 1700   PT Stop Time 1748   PT Time Calculation (min) 48 min   Activity Tolerance Patient tolerated treatment well   Behavior During Therapy [facility] for tasks assessed/performed      History reviewed. No pertinent past medical history.  History reviewed. No pertinent surgical history.  There were no vitals filed for this visit.      Subjective Assessment - 04/18/16 1659    Subjective Reports that he has been doing okay until yesterday he twisted the right ankle.   Currently in Pain? Yes   Pain Score 4    Pain Location Foot   Pain Orientation Left;Medial                         [facility] Adult PT Treatment/Exercise - 04/18/16 0001      Modalities   Modalities Ultrasound;Vasopneumatic     Ultrasound   Ultrasound Location left medial ankle and navicular area   Ultrasound Parameters 3. 100% 1.4 w/cm2   Ultrasound Goals Pain     Vasopneumatic   Number Minutes Vasopneumatic  15 minutes   Vasopnuematic Location  Ankle   Vasopneumatic Pressure Medium   Vasopneumatic Temperature  32     Manual Therapy   Manual Therapy Taping   Manual therapy comments leuko tape, plantar support and then pulling from 5th metatarsal along the plantar surface over the navicular up to just behind the medial malleolous to provide post tib support                  PT Short Term Goals - 04/18/16 1749      PT SHORT TERM GOAL #1   Title independent with initial HEP   Status Achieved           PT Long Term Goals - 04/10/16 1608      PT  LONG TERM GOAL #1   Title no difficulty walking   Time 8   Period Weeks   Status New     PT LONG TERM GOAL #2   Title decrease pain 50%   Time 8   Period Weeks   Status New     PT LONG TERM GOAL #3   Title increase DF actively to 10 degrees   Time 8   Period Weeks   Status New               Plan - 04/18/16 1748    Clinical Impression Statement reports that exercises are okay, he twisted his ankle yesterday.  Some swelling in the posterior tib area on the left foot.  Some tenderness in the area.  I tried taping today   PT Next Visit Plan see if this treatment today helped.   Consulted and Agree with Plan of Care Patient      Patient will benefit from skilled therapeutic intervention in order to improve the following deficits and impairments:  Abnormal gait, Decreased range of motion, Difficulty walking, Decreased strength, Pain  Visit Diagnosis: Pain in left ankle  and joints of left foot  Pain in right ankle and joints of right foot  Difficulty in walking, not elsewhere classified  Localized swelling, mass and lump, left lower limb     Problem List Patient Active Problem List   Diagnosis Date Noted  . Toe pain, right 02/02/2016  . Posterior tibialis tendinitis of both lower extremities 01/05/2016    Jearld LeschALBRIGHT,Davielle Lingelbach W., PT 04/18/2016, 5:50 PM  [facility] [address] [place], [place], [address] Phone: [phone]   Fax:  [phone]  Name: Graciella Freerngel Manocchio MRN: [id number] Date of Birth: 01/21/1976

## 2016-05-02 ENCOUNTER — Ambulatory Visit: Payer: BLUE CROSS/BLUE SHIELD | Admitting: Physical Therapy

## 2016-06-23 ENCOUNTER — Encounter: Payer: Self-pay | Admitting: Physical Therapy

## 2016-07-03 ENCOUNTER — Ambulatory Visit: Payer: BLUE CROSS/BLUE SHIELD | Attending: Family Medicine | Admitting: Physical Therapy

## 2016-07-03 ENCOUNTER — Encounter: Payer: Self-pay | Admitting: Physical Therapy

## 2016-07-03 DIAGNOSIS — M25571 Pain in right ankle and joints of right foot: Secondary | ICD-10-CM | POA: Diagnosis present

## 2016-07-03 DIAGNOSIS — M25572 Pain in left ankle and joints of left foot: Secondary | ICD-10-CM

## 2016-07-03 DIAGNOSIS — R262 Difficulty in walking, not elsewhere classified: Secondary | ICD-10-CM | POA: Insufficient documentation

## 2016-07-03 NOTE — Therapy (Addendum)
[place], [place], [address] Phone: [phone]   Fax:  [phone]  Physical Therapy Treatment  Patient Details  Name: Donald Estes MRN: [id number] Date of Birth: March 03, 1976 Referring Provider: Lynne Leader  Encounter Date: 07/03/2016      PT End of Session - 07/03/16 1153    Visit Number 3   Date for PT Re-Evaluation 08/02/16   PT Start Time 1102   PT Stop Time 1150   PT Time Calculation (min) 48 min   Activity Tolerance Patient tolerated treatment well   Behavior During Therapy [facility] for tasks assessed/performed      History reviewed. No pertinent past medical history.  History reviewed. No pertinent surgical history.  There were no vitals filed for this visit.      Subjective Assessment - 07/03/16 1146    Subjective Patient has not been in in over 2 months, he reports that he has been fairly diligent about doing the stretches and is wearing good shoes at all times.  He reports that he is better, less pain and taking less pain meds, reports that he is walking better but still having some pain with walking or standing   Currently in Pain? Yes   Pain Score 2    Pain Location Foot   Pain Orientation Left   Pain Descriptors / Indicators Aching   Aggravating Factors  still pain with the first few steps                         [facility] Adult PT Treatment/Exercise - 07/03/16 0001      Ultrasound   Ultrasound Location bilateral medial ankles navicular areas and into the PF   Ultrasound Parameters 3.3MHz 100%   Ultrasound Goals Pain     Iontophoresis   Type of Iontophoresis Dexamethasone   Location bilateral navicular area   Dose 14m dose   Time 4 hour patch     Manual Therapy   Manual Therapy Passive ROM;Soft tissue mobilization   Soft tissue mobilization use of ice probe over the PF and the posterior tibialis tendon   Passive ROM toes and ankles to stretch the  achilles and PF                  PT Short Term Goals - 04/18/16 1749      PT SHORT TERM GOAL #1   Title independent with initial HEP   Status Achieved           PT Long Term Goals - 07/03/16 1156      PT LONG TERM GOAL #1   Title no difficulty walking   Status Partially Met     PT LONG TERM GOAL #2   Title decrease pain 50%   Status Partially Met     PT LONG TERM GOAL #3   Title increase DF actively to 10 degrees   Status Achieved               Plan - 07/03/16 1154    Clinical Impression Statement Patient is overall doing better, he still has to adjust his lifestyle due to pain, reporting he has to take medicine and think about what he is going to do and plan out so he does not hurt, he is doing well with stretches and wearing good shoes at most times.  Still seems to have issues and is worried about what to do next.  PT Next Visit Plan I asked him to return to his MD for further guidance   Consulted and Agree with Plan of Care Patient      Patient will benefit from skilled therapeutic intervention in order to improve the following deficits and impairments:  Abnormal gait, Decreased range of motion, Difficulty walking, Decreased strength, Pain  Visit Diagnosis: Pain in left ankle and joints of left foot  Pain in right ankle and joints of right foot  Difficulty in walking, not elsewhere classified     Problem List Patient Active Problem List   Diagnosis Date Noted  . Toe pain, right 02/02/2016  . Posterior tibialis tendinitis of both lower extremities 01/05/2016    PHYSICAL THERAPY DISCHARGE SUMMARY  Visits from Start of Care: 3  Plan: Patient agrees to discharge.  Patient goals were not met. Patient is being discharged due to being pleased with the current functional level.  ?????      Sumner Boast., PT 07/03/2016, 11:56 AM  [place]  [place], [place], [address] Phone: [phone]   Fax:  [phone]  Name: Donald Estes MRN: [id number] Date of Birth: 1975-12-01

## 2016-07-12 ENCOUNTER — Ambulatory Visit (INDEPENDENT_AMBULATORY_CARE_PROVIDER_SITE_OTHER): Payer: BLUE CROSS/BLUE SHIELD | Admitting: Family Medicine

## 2016-07-12 ENCOUNTER — Encounter: Payer: Self-pay | Admitting: Family Medicine

## 2016-07-12 VITALS — BP 139/79 | HR 128 | Wt 207.0 lb

## 2016-07-12 DIAGNOSIS — M76822 Posterior tibial tendinitis, left leg: Secondary | ICD-10-CM

## 2016-07-12 DIAGNOSIS — M76821 Posterior tibial tendinitis, right leg: Secondary | ICD-10-CM | POA: Diagnosis not present

## 2016-07-12 MED ORDER — PREDNISONE 5 MG (48) PO TBPK: ORAL_TABLET | ORAL | 0 refills | Status: DC

## 2016-07-12 NOTE — Progress Notes (Signed)
   Donald Estes is a 41 y.o. male who presents to [facility] today for bilateral right worse than left medial ankle pain. Patient has been seen multiple times for posterior tibialis tendinitis. He's had trials of conservative management including diclofenac gel meloxicam and nitroglycerin patches. Most recently he's been attending physical therapy. He notes moderate improvement in symptoms. He is wondering what the next step is because he still has some symptoms.   No past medical history on file. No past surgical history on file. Social History  Substance Use Topics  . Smoking status: Former Smoker    Quit date: 03/13/2014  . Smokeless tobacco: Never Used  . Alcohol use No     ROS:  As above   Medications: Current Outpatient Prescriptions  Medication Sig Dispense Refill  . glucosamine-chondroitin 500-400 MG tablet Take 1 tablet by mouth 3 (three) times daily.    . Naproxen Sodium (ALEVE PO) Take by mouth.    . diclofenac sodium (VOLTAREN) 1 % GEL APP 2 GRAMS EXT AA QID  10  . meloxicam (MOBIC) 15 MG tablet TAKE 1 TABLET(15 MG) BY MOUTH DAILY (Patient not taking: Reported on 07/12/2016) 30 tablet 2  . nitroGLYCERIN (NITRODUR - DOSED IN MG/24 HR) 0.2 mg/hr patch APP 1/4 PATCH TO ANKLES D FOR TENDONITIS  1  . predniSONE (STERAPRED UNI-PAK 48 TAB) 5 MG (48) TBPK tablet 12 day dosepack po 48 tablet 0   No current facility-administered medications for this visit.    No Known Allergies   Exam:  BP 139/79   Pulse (!) 128   Wt 207 lb (93.9 kg) Comment: wt from 04-03-16  BMI 28.87 kg/m  heart rate 80 beats) per my check today General: Well Developed, well nourished, and in no acute distress.  Neuro/Psych: Alert and oriented x3, extra-ocular muscles intact, able to move all 4 extremities, sensation grossly intact. Skin: Warm and dry, no rashes noted.  Respiratory: Not using accessory muscles, speaking in full sentences, trachea midline.    Cardiovascular: Pulses palpable, no extremity edema. Abdomen: Does not appear distended. MSK: Feet bilaterally is unremarkable appearing. The right medial ankle is mildly tender along the course of the posterior tibialis tendon.  Limited musculoskeletal ultrasound shows an intact posterior tibialis tendon slightly enlarged with a hypoechoic change surrounding the tendon sheath consistent with tenosynovitis. There is some intrasubstance hypoechoic change consistent with tendinosis however there is no disruption of the fibers seen on ultrasound. Normal bony structures.    No results found for this or any previous visit (from the past 48 hour(s)). No results found.    Assessment and Plan: 41 y.o. male with posterior tibialis tendinitis. Discussed options. Patient is considering injection. He would like to try course of oral steroids first. Plan to use a 12 day course of oral prednisone. If not better will proceed with injection. Consider MRI at some point as well.    No orders of the defined types were placed in this encounter.   Discussed warning signs or symptoms. Please see discharge instructions. Patient expresses understanding.

## 2016-07-12 NOTE — Patient Instructions (Signed)
Thank you for coming in today. Take the prednisone.  Return if not better.   Give the prednisone 3-4 weeks.   We can do a shot as needed.

## 2016-08-14 ENCOUNTER — Ambulatory Visit: Payer: BLUE CROSS/BLUE SHIELD | Admitting: Family Medicine

## 2016-08-14 DIAGNOSIS — Z0189 Encounter for other specified special examinations: Secondary | ICD-10-CM

## 2016-10-24 ENCOUNTER — Encounter: Payer: Self-pay | Admitting: Family Medicine

## 2016-10-24 ENCOUNTER — Ambulatory Visit (INDEPENDENT_AMBULATORY_CARE_PROVIDER_SITE_OTHER): Payer: BLUE CROSS/BLUE SHIELD | Admitting: Family Medicine

## 2016-10-24 VITALS — BP 144/89 | HR 100 | Wt 209.0 lb

## 2016-10-24 DIAGNOSIS — M7741 Metatarsalgia, right foot: Secondary | ICD-10-CM | POA: Diagnosis not present

## 2016-10-24 DIAGNOSIS — M7742 Metatarsalgia, left foot: Secondary | ICD-10-CM

## 2016-10-24 DIAGNOSIS — M76822 Posterior tibial tendinitis, left leg: Secondary | ICD-10-CM

## 2016-10-24 DIAGNOSIS — R030 Elevated blood-pressure reading, without diagnosis of hypertension: Secondary | ICD-10-CM

## 2016-10-24 DIAGNOSIS — M76821 Posterior tibial tendinitis, right leg: Secondary | ICD-10-CM

## 2016-10-24 MED ORDER — NAPROXEN 500 MG PO TABS: 500.0000 mg | ORAL_TABLET | Freq: Two times a day (BID) | ORAL | 6 refills | Status: AC

## 2016-10-24 MED ORDER — OMEPRAZOLE 40 MG PO CPDR: 40.0000 mg | DELAYED_RELEASE_CAPSULE | Freq: Every day | ORAL | 3 refills | Status: AC

## 2016-10-24 MED ORDER — MELOXICAM 15 MG PO TABS: 15.0000 mg | ORAL_TABLET | Freq: Every day | ORAL | 3 refills | Status: DC

## 2016-10-24 NOTE — Patient Instructions (Addendum)
Thank you for coming in today. For pain take melxoicam or aleve during the day.  Do not take both on the same day.  We will continue to follow along.  Continue exercises.  If not better we will do an MRI in preparation for injection or surgery.   Take omeprazole to prevent a stomach ulcer.   Return yearly as needed.  We should be able to renew handicap form yearly.

## 2016-10-24 NOTE — Progress Notes (Signed)
Donald Estes is a 41 y.o. male who presents to [facility] today for follow up ankle pain.   [place] notes improved but continued mild medial ankle pain.  Patient has bilateral elbow pain worse in the right attributable to posterior tibialis tendinopathy. He's had trials of multiple different therapy but seems to be doing quite well with meloxicam and naproxen. He takes these medications alternating but not on the same day. He thinks his regimen helps. He notes he denies any abdominal pain and dark tarry stool or blood in the stool. He feels well. He notes that he needs a work note allowing him to wear comfortable sneakers at work which definitely helps. Additionally he would like to renew his handicap form if possible. He continues home exercise program which seems to work for him.   No past medical history on file. No past surgical history on file. Social History  Substance Use Topics  . Smoking status: Former Smoker    Quit date: 03/13/2014  . Smokeless tobacco: Never Used  . Alcohol use No     ROS:  As above   Medications: Current Outpatient Prescriptions  Medication Sig Dispense Refill  . meloxicam (MOBIC) 15 MG tablet Take 1 tablet (15 mg total) by mouth daily. 90 tablet 3  . naproxen (NAPROSYN) 500 MG tablet Take 1 tablet (500 mg total) by mouth 2 (two) times daily with a meal. 60 tablet 6  . omeprazole (PRILOSEC) 40 MG capsule Take 1 capsule (40 mg total) by mouth daily. 90 capsule 3   No current facility-administered medications for this visit.    No Known Allergies   Exam:  BP (!) 144/89   Pulse 100   Wt 209 lb (94.8 kg)   BMI 29.15 kg/m  General: Well Developed, well nourished, and in no acute distress.  Neuro/Psych: Alert and oriented x3, extra-ocular muscles intact, able to move all 4 extremities, sensation grossly intact. Skin: Warm and dry, no rashes noted.  Respiratory: Not using accessory muscles, speaking in full  sentences, trachea midline.  Cardiovascular: Pulses palpable, no extremity edema. Abdomen: Does not appear distended. MSK:  Ankles are normal-appearing bilaterally. Nontender. Normal motion. Normal heel valgus with toe standing.    No results found for this or any previous visit (from the past 48 hour(s)). No results found.    Assessment and Plan: 41 y.o. 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 Plan to continue prescription NSAIDs which seems to be working. We'll go ahead and prescribe omeprazole for GI prophylaxis on these long-term medications. Continue handicap placard as well. I'll be happy to renew this in 6 months as this is a temporary thing. He should return in 12 months for recheck if no change.  Blood pressure is a bit elevated today. I recommend that he get this addressed at his primary care provider office should he choose. NSAIDs are likely a contributing factor here however.    No orders of the defined types were placed in this encounter.  Meds ordered this encounter  Medications  . meloxicam (MOBIC) 15 MG tablet    Sig: Take 1 tablet (15 mg total) by mouth daily.    Dispense:  90 tablet    Refill:  3  . naproxen (NAPROSYN) 500 MG tablet    Sig: Take 1 tablet (500 mg total) by mouth 2 (two) times daily with a meal.    Dispense:  60 tablet  Refill:  6  . omeprazole (PRILOSEC) 40 MG capsule    Sig: Take 1 capsule (40 mg total) by mouth daily.    Dispense:  90 capsule    Refill:  3    Discussed warning signs or symptoms. Please see discharge instructions. Patient expresses understanding.

## 2016-12-19 ENCOUNTER — Telehealth: Payer: Self-pay

## 2016-12-19 DIAGNOSIS — M76821 Posterior tibial tendinitis, right leg: Secondary | ICD-10-CM

## 2016-12-19 DIAGNOSIS — M76822 Posterior tibial tendinitis, left leg: Principal | ICD-10-CM

## 2016-12-19 NOTE — Telephone Encounter (Signed)
Pts wife called stating that pt would like to have an MRI to evaluate Posterior tibialis tendinitis of both lower extremities and Metatarsalgia of both feet. Please advise.

## 2016-12-20 NOTE — Telephone Encounter (Signed)
Information discussed with pts wife. States that the pain alternates between both ankles and its difficult to say which is worse. Advised that this will be routed to provider but to still ask pt which one bothers him more.

## 2016-12-20 NOTE — Telephone Encounter (Signed)
The insurance will typically only allow one ankle at a time.  Which one is worse?  Clayburn Pert

## 2016-12-22 NOTE — Telephone Encounter (Signed)
Left side is the worst.

## 2016-12-22 NOTE — Telephone Encounter (Signed)
Have him pick a side and we will MRI that one first

## 2016-12-24 ENCOUNTER — Emergency Department (HOSPITAL_BASED_OUTPATIENT_CLINIC_OR_DEPARTMENT_OTHER)
Admission: EM | Admit: 2016-12-24 | Discharge: 2016-12-24 | Disposition: A | Discharge status: Home / Self Care | Payer: BLUE CROSS/BLUE SHIELD | Attending: Emergency Medicine | Admitting: Emergency Medicine

## 2016-12-24 ENCOUNTER — Encounter (HOSPITAL_BASED_OUTPATIENT_CLINIC_OR_DEPARTMENT_OTHER): Payer: Self-pay | Admitting: Emergency Medicine

## 2016-12-24 ENCOUNTER — Emergency Department (HOSPITAL_BASED_OUTPATIENT_CLINIC_OR_DEPARTMENT_OTHER): Payer: BLUE CROSS/BLUE SHIELD

## 2016-12-24 DIAGNOSIS — Z79899 Other long term (current) drug therapy: Secondary | ICD-10-CM | POA: Insufficient documentation

## 2016-12-24 DIAGNOSIS — Z87891 Personal history of nicotine dependence: Secondary | ICD-10-CM | POA: Insufficient documentation

## 2016-12-24 DIAGNOSIS — S52502A Unspecified fracture of the lower end of left radius, initial encounter for closed fracture: Secondary | ICD-10-CM

## 2016-12-24 DIAGNOSIS — M545 Low back pain: Secondary | ICD-10-CM | POA: Insufficient documentation

## 2016-12-24 DIAGNOSIS — M25512 Pain in left shoulder: Secondary | ICD-10-CM | POA: Diagnosis not present

## 2016-12-24 DIAGNOSIS — Y9389 Activity, other specified: Secondary | ICD-10-CM | POA: Diagnosis not present

## 2016-12-24 DIAGNOSIS — S6992XA Unspecified injury of left wrist, hand and finger(s), initial encounter: Secondary | ICD-10-CM | POA: Diagnosis present

## 2016-12-24 DIAGNOSIS — Y92410 Unspecified street and highway as the place of occurrence of the external cause: Secondary | ICD-10-CM | POA: Diagnosis not present

## 2016-12-24 DIAGNOSIS — Y998 Other external cause status: Secondary | ICD-10-CM | POA: Insufficient documentation

## 2016-12-24 MED ORDER — TRAMADOL HCL 50 MG PO TABS: 50.0000 mg | ORAL_TABLET | Freq: Four times a day (QID) | ORAL | 0 refills | Status: AC | PRN
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
## 2016-12-24 NOTE — ED Notes (Signed)
Pt and SO given d/c instructions as per chart. Verbalizes understanding. No questions. Rx x 1 with precautions. 
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
## 2016-12-25 NOTE — Telephone Encounter (Signed)
Information discussed with pts wife.

## 2016-12-25 NOTE — Telephone Encounter (Signed)
Left ankle MRI ordered.

## 2016-12-26 ENCOUNTER — Encounter: Payer: Self-pay | Admitting: Family Medicine

## 2016-12-26 ENCOUNTER — Ambulatory Visit (INDEPENDENT_AMBULATORY_CARE_PROVIDER_SITE_OTHER): Payer: BLUE CROSS/BLUE SHIELD | Admitting: Family Medicine

## 2016-12-26 DIAGNOSIS — S4992XA Unspecified injury of left shoulder and upper arm, initial encounter: Secondary | ICD-10-CM | POA: Diagnosis not present

## 2016-12-26 DIAGNOSIS — S6992XA Unspecified injury of left wrist, hand and finger(s), initial encounter: Secondary | ICD-10-CM | POA: Diagnosis not present

## 2016-12-26 NOTE — Patient Instructions (Signed)
You have a possible distal radius fracture but the x-rays are not conclusive. Keep the splint on and follow up with me in 1 week for reevaluation and repeat x-rays. Tylenol as needed for pain. Meloxicam OR aleve for pain and inflammation but don't take both.  You have a shoulder separation (AC sprain) and rotator cuff strain. Ice the area 3-4 times a day for 15 minutes at a time Tylenol and/or aleve as needed for pain Range of motion exercises when tolerated (arm circles, wall walking, pendulum) When able to do these range of motion exercises, will advance to rotator cuff and scapular stabilizer strengthening/exercises. Follow up with me in 1 week for reevaluation of your shoulder and wrist..

## 2016-12-28 DIAGNOSIS — S4992XD Unspecified injury of left shoulder and upper arm, subsequent encounter: Secondary | ICD-10-CM | POA: Insufficient documentation

## 2016-12-28 DIAGNOSIS — S6992XD Unspecified injury of left wrist, hand and finger(s), subsequent encounter: Secondary | ICD-10-CM | POA: Insufficient documentation
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
## 2016-12-28 NOTE — Assessment & Plan Note (Signed)
independently reviewed radiographs - possible distal radius fracture but inconclusive on initial radiographs.  Will keep sugar tong splint on - f/u in 1 week to remove this, repeat exam and radiographs.  Tylenol, meloxicam or aleve.

## 2016-12-28 NOTE — Assessment & Plan Note (Signed)
2/2 Grade 1-2 AC sprain and rotator cuff strain.  Icing, tylenol, meloxicam, or aleve.  Shown motion exercises he can start.  F/u in 1 week to reevaluate.

## 2017-01-02 ENCOUNTER — Ambulatory Visit (INDEPENDENT_AMBULATORY_CARE_PROVIDER_SITE_OTHER): Payer: BLUE CROSS/BLUE SHIELD | Admitting: Family Medicine

## 2017-01-02 ENCOUNTER — Encounter: Payer: Self-pay | Admitting: Family Medicine

## 2017-01-02 DIAGNOSIS — S4992XD Unspecified injury of left shoulder and upper arm, subsequent encounter: Secondary | ICD-10-CM | POA: Diagnosis not present

## 2017-01-02 DIAGNOSIS — S6992XD Unspecified injury of left wrist, hand and finger(s), subsequent encounter: Secondary | ICD-10-CM

## 2017-01-02 NOTE — Patient Instructions (Signed)
You have at least a Grade 2 skiiers (or gamekeepers) thumb. Wear the brace at all times except can take off briefly to wash your hand but be careful. Tylenol, ibuprofen if needed for pain. We will have to let this heal before you can start the rehab for your left shoulder.  You have strained one of your rotator cuff muscles. Try to avoid painful activities (overhead activities, lifting with extended arm) as much as possible. Aleve 2 tabs twice a day with food OR ibuprofen 3 tabs three times a day with food for pain and inflammation as needed. Can take tylenol in addition to this. Consider physical therapy with transition to home exercise program in the future. If not improving at follow-up we will consider further imaging, injection, physical therapy, and/or nitro patches. Follow up with me in 3 weeks.

## 2017-01-04 NOTE — Assessment & Plan Note (Signed)
AC sprain has healed - still dealing with strain or supraspinatus though.  Aleve or ibuprofen.  Shown home exercises to do daily.  Consider PT, further imaging if not improving.  F/u in 3 weeks.
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
## 2017-01-04 NOTE — Assessment & Plan Note (Signed)
wrist improved - 2/2 sprain or mild contusion.  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  Thumbkeeper brace applied to wear at all times.  Tylenol or ibuprofen if needed.   F/u in 3 weeks.

## 2017-01-08 ENCOUNTER — Ambulatory Visit (INDEPENDENT_AMBULATORY_CARE_PROVIDER_SITE_OTHER): Payer: BLUE CROSS/BLUE SHIELD

## 2017-01-08 DIAGNOSIS — M76822 Posterior tibial tendinitis, left leg: Secondary | ICD-10-CM

## 2017-01-08 DIAGNOSIS — M25472 Effusion, left ankle: Secondary | ICD-10-CM | POA: Diagnosis not present

## 2017-01-08 DIAGNOSIS — M76821 Posterior tibial tendinitis, right leg: Secondary | ICD-10-CM

## 2017-01-15 ENCOUNTER — Encounter: Payer: Self-pay | Admitting: Family Medicine

## 2017-01-15 ENCOUNTER — Ambulatory Visit: Payer: BLUE CROSS/BLUE SHIELD | Admitting: Family Medicine

## 2017-01-15 VITALS — BP 131/91 | HR 100

## 2017-01-15 DIAGNOSIS — M659 Synovitis and tenosynovitis, unspecified: Secondary | ICD-10-CM

## 2017-01-15 DIAGNOSIS — M76821 Posterior tibial tendinitis, right leg: Secondary | ICD-10-CM | POA: Diagnosis not present

## 2017-01-15 DIAGNOSIS — M76822 Posterior tibial tendinitis, left leg: Secondary | ICD-10-CM | POA: Diagnosis not present

## 2017-01-15 NOTE — Patient Instructions (Addendum)
Thank you for coming in today. Recheck with me in 4 weeks.  Work on the right elbow exercises we discussed.  Call or go to the ER if you develop a large red swollen joint with extreme pain or oozing puss.    [facility] Ask your health care provider which exercises are safe for you. Do exercises exactly as told by your health care provider and adjust them as directed. It is normal to feel mild stretching, pulling, tightness, or discomfort as you do these exercises, but you should stop right away if you feel sudden pain or your pain gets worse. Do not begin these exercises until told by your health care provider. Stretching and range of motion exercises These exercises warm up your muscles and joints and improve the movement and flexibility of your elbow. These exercises also help to relieve pain, numbness, and tingling. Exercise A: Wrist extensor stretch 1. Extend your left / right elbow with your fingers pointing down. 2. Gently pull the palm of your left / right hand toward you until you feel a gentle stretch on the top of your forearm. 3. To increase the stretch, push your left / right hand toward the outer edge or pinkie side of your forearm. 4. Hold this position for __________ seconds. Repeat __________ times. Complete this exercise __________ times a day. If directed by your health care provider, repeat this stretch except do it with a bent elbow this time. Exercise B: Wrist flexor stretch  1. Extend your left / right elbow and turn your palm upward. 2. Gently pull your left / right palm and fingertips back so your wrist extends and your fingers point more toward the ground. 3. You should feel a gentle stretch on the inside of your forearm. 4. Hold this position for __________ seconds. Repeat __________ times. Complete this exercise __________ times a day. If directed by your health care provider, repeat this stretch except do it with a bent elbow this time. Strengthening  exercises These exercises build strength and endurance in your elbow. Endurance is the ability to use your muscles for a long time, even after they get tired. Exercise C: Wrist extensors  1. Sit with your left / right forearm palm-down and fully supported on a table or countertop. Your elbow should be resting below the height of your shoulder. 2. Let your left / right wrist extend over the edge of the surface. 3. Loosely hold a __________ weight or a piece of rubber exercise band or tubing in your left / right hand. Slowly curl your left / right hand up toward your forearm. If you are using band or tubing, hold the band or tubing in place with your other hand to provide resistance. 4. Hold this position for __________ seconds. 5. Slowly return to the starting position. Repeat __________ times. Complete this exercise __________ times a day. Exercise D: Radial deviators  1. Stand with a __________ weight in your left / righthand. Or, sit while holding a rubber exercise band or tubing with your other arm supported on a table or countertop. Position your hand so your thumb is on top. 2. Raise your hand upward in front of you so your thumb travels toward your forearm, or pull up on the rubber tubing. 3. Hold this position for __________ seconds. 4. Slowly return to the starting position. Repeat __________ times. Complete this exercise __________ times a day. Exercise E: Eccentric wrist extensors 1. Sit with your left / right forearm palm-down and  fully supported on a table or countertop. Your elbow should be resting below the height of your shoulder. 2. If told by your health care provider, hold a __________ weight in your hand. 3. Let your left / right wrist extend over the edge of the surface. 4. Use your other hand to lift up your left / right hand toward your forearm. Keep your forearm on the table. 5. Using only the muscles in your left / right hand, slowly lower your hand back down to the  starting position. Repeat __________ times. Complete this exercise __________ times a day. This information is not intended to replace advice given to you by your health care provider. Make sure you discuss any questions you have with your health care provider. Document Released: 02/27/2005 Document Revised: 11/03/2015 Document Reviewed: 11/26/2014 Elsevier Interactive Patient Education  [employer].

## 2017-01-16 DIAGNOSIS — M659 Synovitis and tenosynovitis, unspecified: Secondary | ICD-10-CM | POA: Insufficient documentation

## 2017-01-16 NOTE — Progress Notes (Signed)
Donald Estes is a 41 y.o. male who [facility] today for bilateral ankle pain.  Patient has been seen multiple times for ankle pain bilaterally thought to be both posterior tibialis tendinitis and synovitis of the ankle joint.  He has had extensive conservative management and notes moderate improvement in symptoms.  He eventually had an MRI of his left ankle on October 29 showing posterior tibialis tendinitis as well as ankle joint synovitis.  He is here today to discuss options.  He is interested pain interfering with his quality of life.  He continues to use naproxen intermittently.   No past medical history on file. No past surgical history on file. Social History   Tobacco Use  . Smoking status: Former Smoker    Last attempt to quit: 03/13/2014    Years since quitting: 2.8  . Smokeless tobacco: Never Used  Substance Use Topics  . Alcohol use: No    Alcohol/week: 0.0 oz     ROS:  As above   Medications: Current Outpatient Medications  Medication Sig Dispense Refill  . meloxicam (MOBIC) 15 MG tablet Take 1 tablet (15 mg total) by mouth daily. 90 tablet 3  . naproxen (NAPROSYN) 500 MG tablet Take 1 tablet (500 mg total) by mouth 2 (two) times daily with a meal. 60 tablet 6  . omeprazole (PRILOSEC) 40 MG capsule Take 1 capsule (40 mg total) by mouth daily. 90 capsule 3  . traMADol (ULTRAM) 50 MG tablet Take 1 tablet (50 mg total) by mouth every 6 (six) hours as needed. 10 tablet 0   No current facility-administered medications for this visit.    No Known Allergies   Exam:  BP (!) 131/91   Pulse 100  General: Well Developed, well nourished, and in no acute distress.  Neuro/Psych: Alert and oriented x3, extra-ocular muscles intact, able to move all 4 extremities, sensation grossly intact. Skin: Warm and dry, no rashes noted.  Respiratory: Not using accessory muscles, speaking in full sentences, trachea midline.    Cardiovascular: Pulses palpable, no extremity edema. Abdomen: Does not appear distended. MSK:  Ankles bilaterally have no effusion in.  Otherwise normal-appearing normal motion.  Tender to palpation along the posterior tibialis tendon.  Stable ligamentous exam.   Procedure: Real-time Ultrasound Guided Injection of left ankle  Device: GE Logiq E  Images permanently stored and available for review in the ultrasound unit. Verbal informed consent obtained. Discussed risks and benefits of procedure. Warned about infection bleeding damage to structures skin hypopigmentation and fat atrophy among others. Patient expresses understanding and agreement Time-out conducted.  Noted no overlying erythema, induration, or other signs of local infection.  Skin prepped in a sterile fashion.  Local anesthesia: Topical Ethyl chloride.  With sterile technique and under real time ultrasound guidance: 40mg  kenalog and 2ml marcaine injected easily.  Completed without difficulty  Pain immediately resolved suggesting accurate placement of the medication.  Advised to call if fevers/chills, erythema, induration, drainage, or persistent bleeding.  Images permanently stored and available for review in the ultrasound unit.  Impression: Technically successful ultrasound guided injection.   Procedure: Real-time Ultrasound Guided Injection of right ankle  Device: GE Logiq E  Images permanently stored and available for review in the ultrasound unit. Verbal informed consent obtained. Discussed risks and benefits of procedure. Warned about infection bleeding damage to structures skin hypopigmentation and fat atrophy among others. Patient expresses understanding and agreement Time-out conducted.  Noted no overlying erythema, induration, or  other signs of local infection.  Skin prepped in a sterile fashion.  Local anesthesia: Topical Ethyl chloride.  With sterile technique and under real time ultrasound  guidance: 40mg  kenalog and 2ml marcaine injected easily.  Completed without difficulty  Pain immediately resolved suggesting accurate placement of the medication.  Advised to call if fevers/chills, erythema, induration, drainage, or persistent bleeding.  Images permanently stored and available for review in the ultrasound unit.  Impression: Technically successful ultrasound guided injection.  CLINICAL DATA:  Pain and swelling.  Posterior tibialis tendinitis.  EXAM: MRI OF THE LEFT ANKLE WITHOUT CONTRAST  TECHNIQUE: Multiplanar, multisequence MR imaging of the ankle was performed. No intravenous contrast was administered.  COMPARISON:  None.  FINDINGS: TENDONS  Peroneal: Intact peroneus longus and peroneus brevis tendons. Normal.  Posteromedial: Fluid along the tendon sheath of the intact posterior tibialis tendon consistent with tenosynovitis. Small amount of fluid in the flexor hallucis longus tendon sheath.  Anterior: Normal.  Achilles: Normal.  Plantar Fascia: Normal plantar fascia.  LIGAMENTS  Lateral: Intact.  Medial: Intact.  CARTILAGE  Ankle Joint: Moderate ankle effusion. Small marginal osteophytes at the tibiotalar joint. No discrete cartilage defects. Edema in the soft tissues around the ankle joint.  Subtalar Joints/Sinus Tarsi: No joint effusion or chondral defect.  Bones: Patchy slight edema in the medial and lateral malleoli. Patchy edema in the medial aspects of the talus and calcaneus. Small focal area of edema in the posterolateral aspect of the calcaneus adjacent to the origin of the lateral band of the plantar fascia.  Other: None  IMPRESSION: 1. Posterior tibialis tenosynovitis. 2. Moderate ankle effusion with edema in the adjacent soft tissues suggesting synovitis of the ankle joint. 3. Patchy edema in the bones at the ankle also of suggestive of synovitis. 4. Focal edema at the posterolateral aspect of the  calcaneus, probably a stress phenomenon. Does the patient have an abnormal gait due to the posterior tibialis tenosynovitis?   Electronically Signed   By: Francene Boyers M.D.   On: 01/08/2017 15:51   Assessment and Plan: 41 y.o. male with  Ankle pain bilaterally.  I believe at this point the majority of the ankle pain is likely ankle joint synovitis based on MRI appearance of the left ankle.  His posterior tibial pain is minimal at this point.  Plan for bilateral ankle injections and recheck in about a month.    No orders of the defined types were placed in this encounter.  No orders of the defined types were placed in this encounter.   Discussed warning signs or symptoms. Please see discharge instructions. Patient expresses understanding.  I spent 25 minutes with this patient, greater than 50% was face-to-face time counseling regarding ddx and treatment plan

## 2017-01-23 ENCOUNTER — Ambulatory Visit: Payer: BLUE CROSS/BLUE SHIELD | Admitting: Family Medicine

## 2017-01-29 ENCOUNTER — Encounter: Payer: Self-pay | Admitting: Family Medicine

## 2017-01-29 ENCOUNTER — Ambulatory Visit (INDEPENDENT_AMBULATORY_CARE_PROVIDER_SITE_OTHER): Payer: BLUE CROSS/BLUE SHIELD | Admitting: Family Medicine

## 2017-01-29 DIAGNOSIS — S4992XD Unspecified injury of left shoulder and upper arm, subsequent encounter: Secondary | ICD-10-CM

## 2017-01-29 DIAGNOSIS — S6992XD Unspecified injury of left wrist, hand and finger(s), subsequent encounter: Secondary | ICD-10-CM

## 2017-01-29 NOTE — Patient Instructions (Signed)
Your thumb has healed - you can stop using the brace now.  You have a supraspinatus strain of your left shoulder. Try to avoid painful activities (overhead activities, lifting with extended arm) as much as possible. Aleve 2 tabs twice a day with food OR ibuprofen 3 tabs three times a day with food for pain and inflammation only if needed. Can take tylenol in addition to this. Cnosider physical therapy with transition to home exercise program. Do home exercise program with theraband and scapular stabilization exercises daily 3 sets of 10 once a day. If not improving at follow-up we will consider further imaging, physical therapy, and/or nitro patches. Follow up with me in 6 weeks.

## 2017-02-03 ENCOUNTER — Encounter: Payer: Self-pay | Admitting: Family Medicine

## 2017-02-03 NOTE — Assessment & Plan Note (Signed)
Grade 2 gamekeepers thumb now healed.  Stop brace.  Tylenol, naproxen only if needed.

## 2017-02-03 NOTE — Assessment & Plan Note (Signed)
AC sprain healed.  Supraspinatus strain still an issue.  Naproxen or ibuprofen only if needed.  Offered PT but he would like to start with home exercise program with theraband.  F/u in 6 weeks.  Consider further imaging, PT, nitro patches if not improving.
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
## 2017-02-12 ENCOUNTER — Ambulatory Visit: Payer: BLUE CROSS/BLUE SHIELD

## 2017-05-28 ENCOUNTER — Other Ambulatory Visit: Payer: Self-pay | Admitting: Physician Assistant

## 2017-05-28 DIAGNOSIS — R7989 Other specified abnormal findings of blood chemistry: Secondary | ICD-10-CM

## 2017-05-28 DIAGNOSIS — R945 Abnormal results of liver function studies: Principal | ICD-10-CM

## 2017-06-06 ENCOUNTER — Ambulatory Visit
Admission: RE | Admit: 2017-06-06 | Discharge: 2017-06-06 | Disposition: A | Discharge status: Home / Self Care | Payer: BLUE CROSS/BLUE SHIELD | Source: Ambulatory Visit | Attending: Physician Assistant | Admitting: Physician Assistant

## 2017-06-06 DIAGNOSIS — R7989 Other specified abnormal findings of blood chemistry: Secondary | ICD-10-CM

## 2017-06-06 DIAGNOSIS — R945 Abnormal results of liver function studies: Principal | ICD-10-CM

## 2018-11-24 IMAGING — US US ABDOMEN LIMITED
1 series · 14 of 25 positions shown · non-contrast
Comparison: None.

CLINICAL DATA: Elevated liver enzymes

EXAM:
ULTRASOUND ABDOMEN LIMITED RIGHT UPPER QUADRANT

[Series 1: us abdomen limited · 0.28mm/px · 14 of 44 slices shown]
[im 1/44]
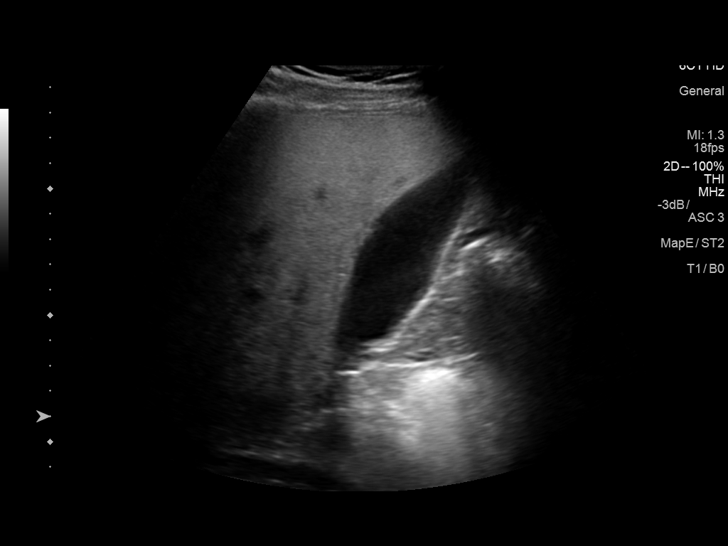
[im 4/44]
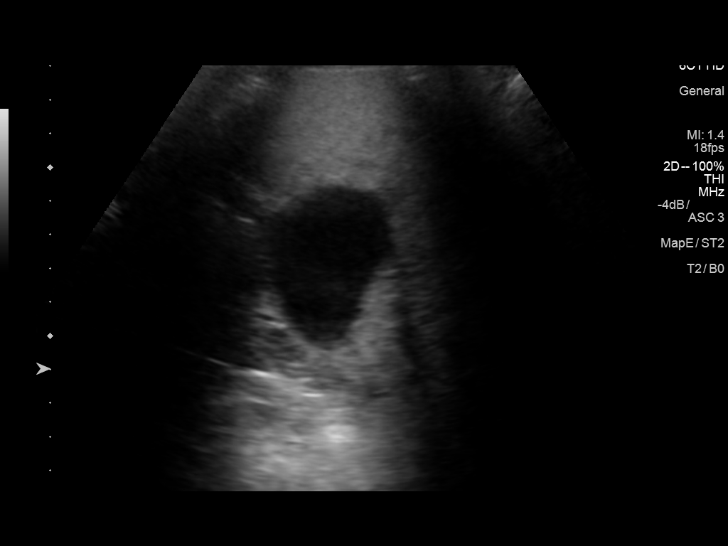
[im 8/44]
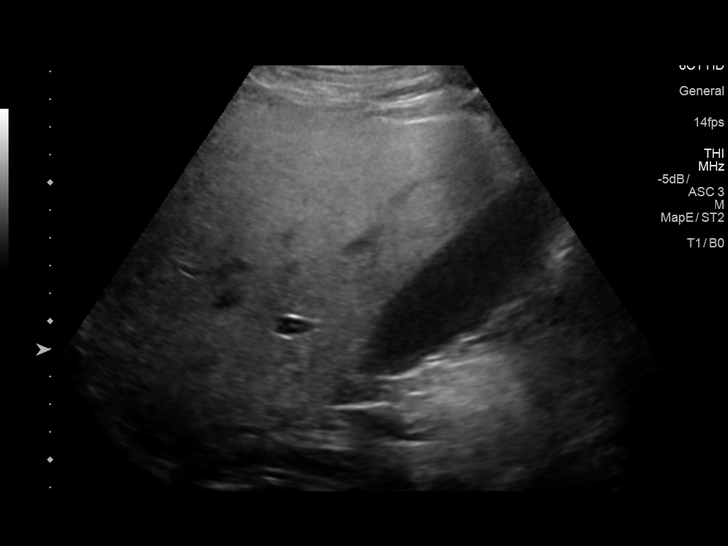
[im 11/44]
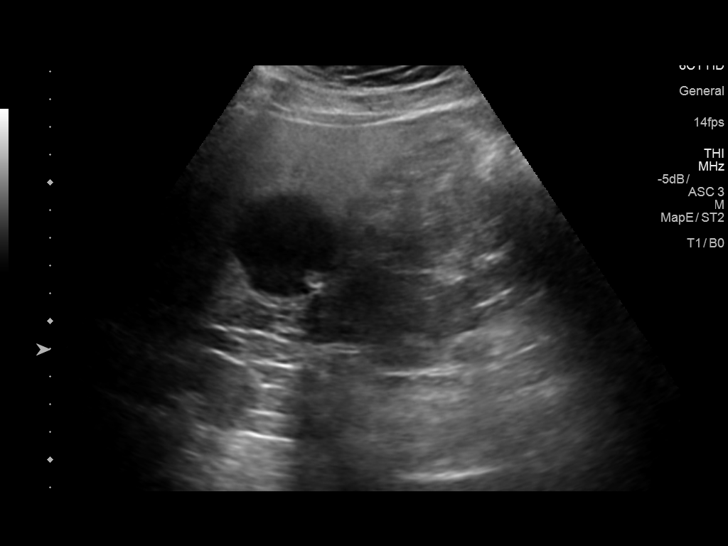
[im 15/44]
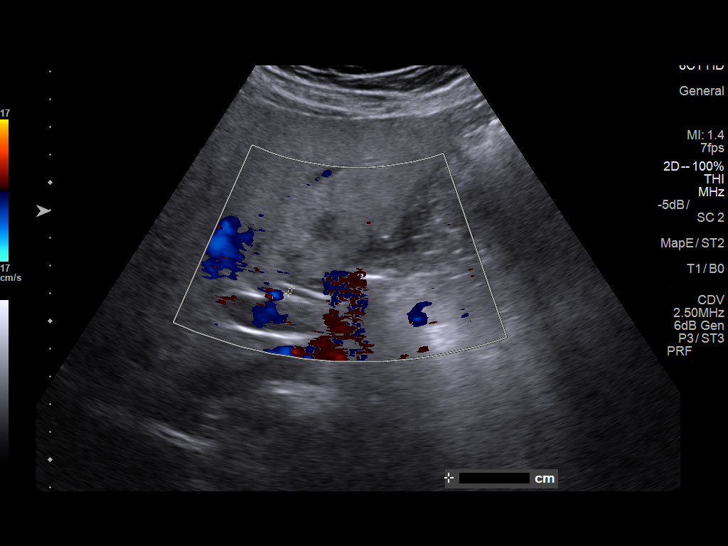
[im 17/44]
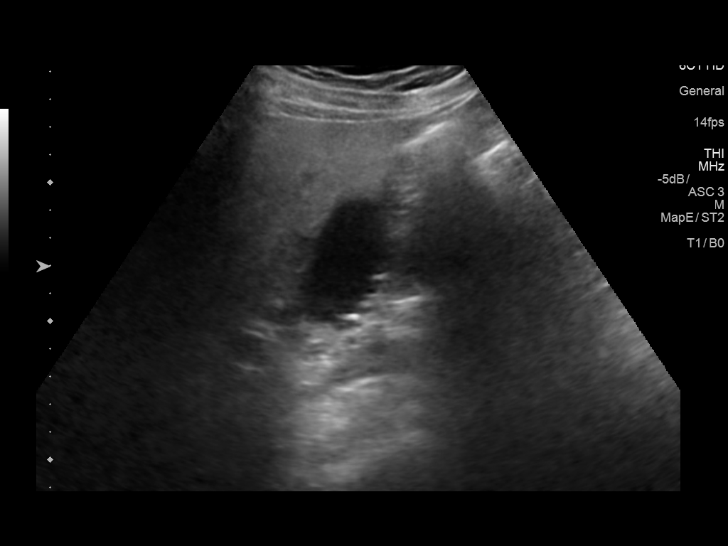
[im 20/44]
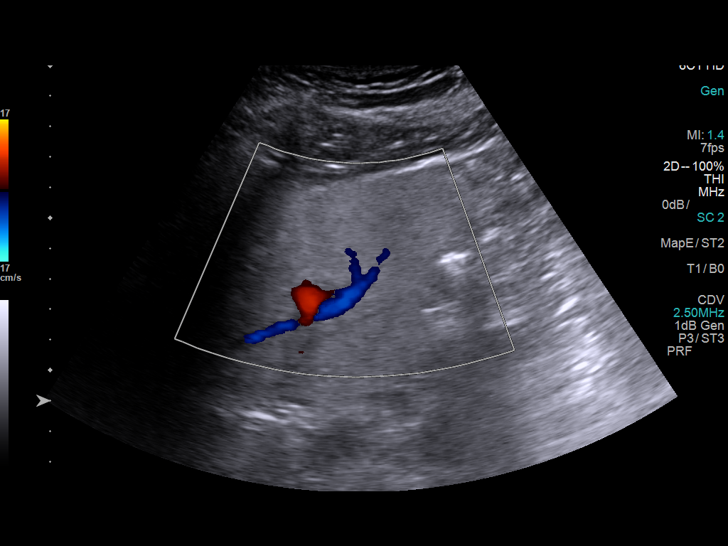
[im 24/44]
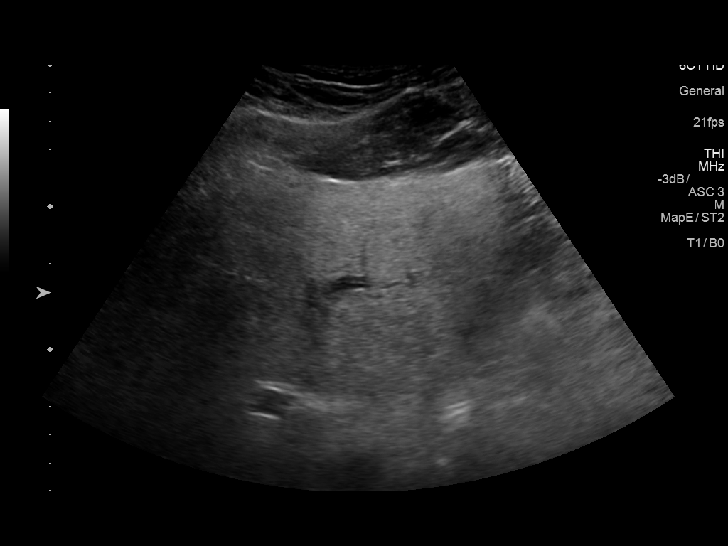
[im 27/44]
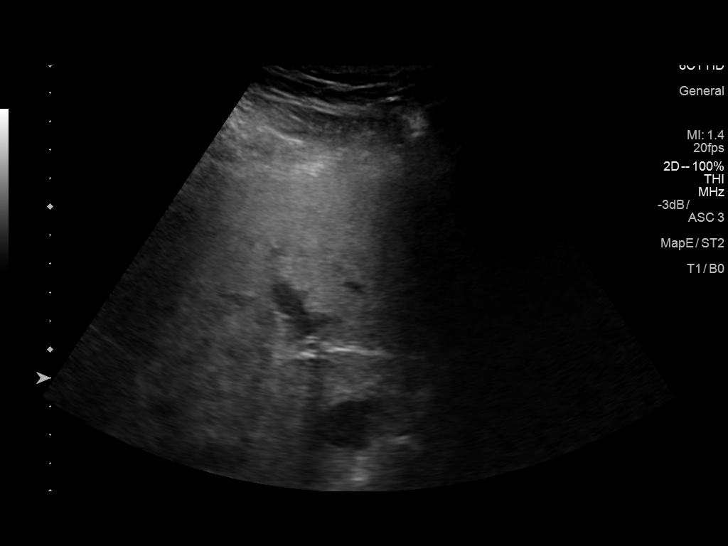
[im 29/44]
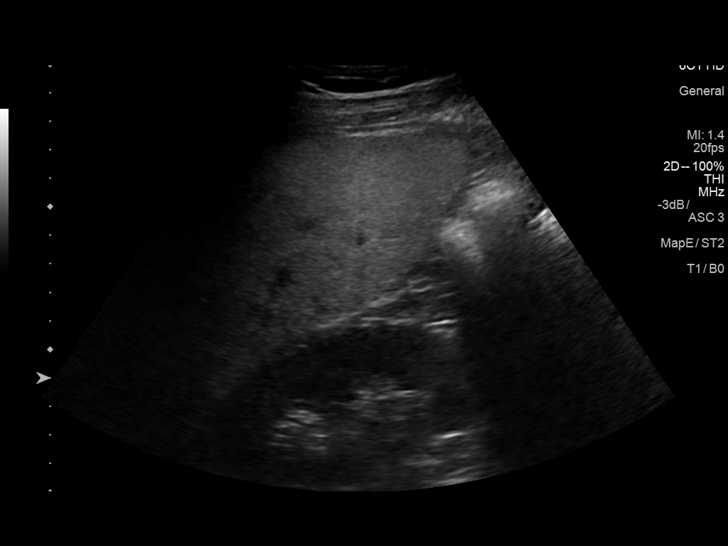
[im 33/44]
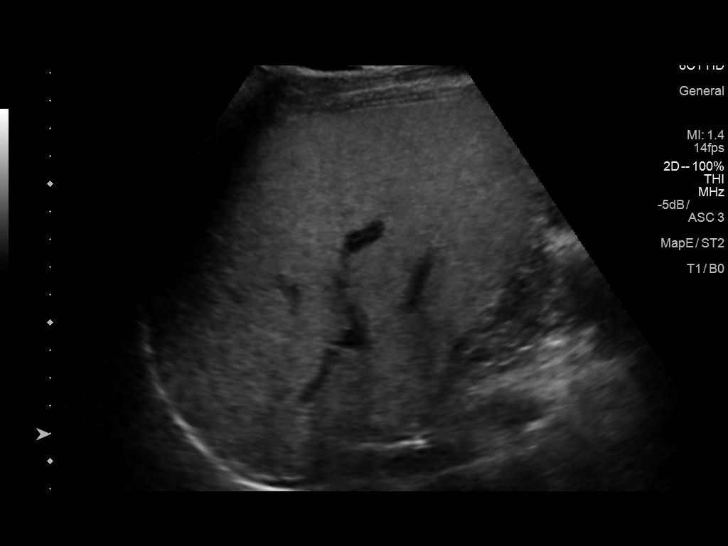
[im 36/44]
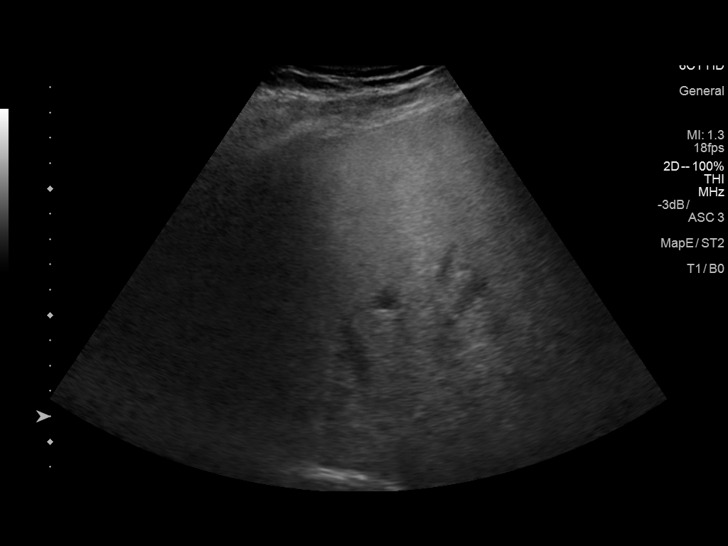
[im 40/44]
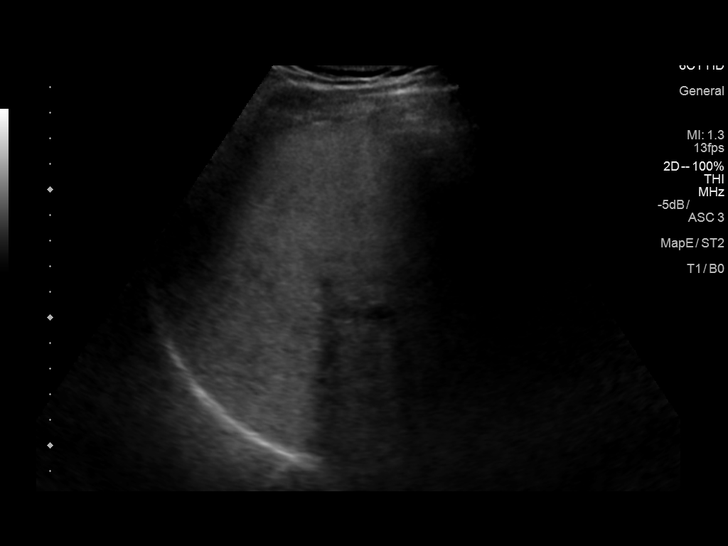
[im 44/44]
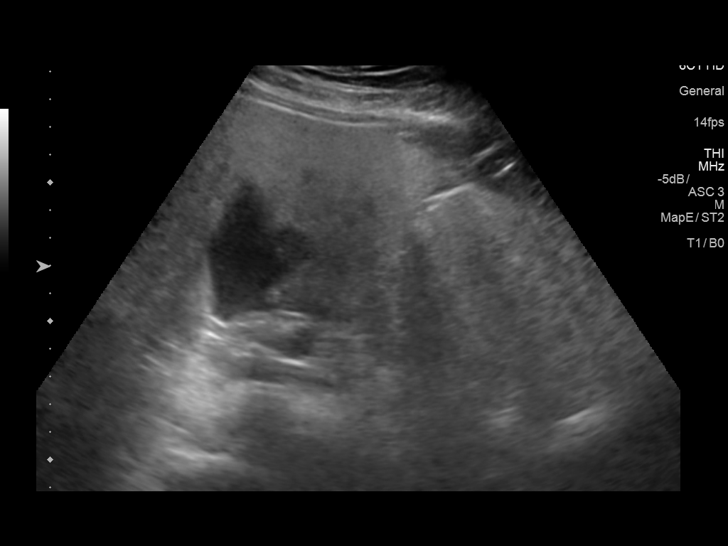

[14 of 25 positions shown; findings below may reference images not displayed]

FINDINGS: Gallbladder:

Within the gallbladder, there are several echogenic foci which move
and shadow consistent with cholelithiasis. Largest gallstone
measures 8 mm in length. No gallbladder wall thickening or
pericholecystic fluid. No sonographic Murphy sign noted by
sonographer.

Common bile duct:

Diameter: 2 mm. No intrahepatic or extrahepatic biliary duct
dilatation.

Liver:

No focal lesion identified. There is diffuse increase in liver
echogenicity. There is probable mild fatty sparing near the
gallbladder fossa. Portal vein is patent on color Doppler imaging
with normal direction of blood flow towards the liver.
IMPRESSION: Cholelithiasis. No gallbladder wall thickening or pericholecystic
fluid.

Diffuse increase in liver echogenicity, a finding felt to be
indicative of hepatic steatosis. While no focal liver lesions are
evident, it must be cautioned that the sensitivity for detection of
focal liver lesions is diminished in this circumstance.
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
